# Patient Record
Sex: Male | Born: 1975 | Race: White | Hispanic: No | Marital: Single | Smoking: Current every day smoker
Health system: Southern US, Community
[De-identification: ages and names within clinical notes are randomized; demographics above are authoritative.]

## PROBLEM LIST (undated history)

## (undated) DIAGNOSIS — M25539 Pain in unspecified wrist: Secondary | ICD-10-CM

## (undated) DIAGNOSIS — M549 Dorsalgia, unspecified: Secondary | ICD-10-CM

## (undated) DIAGNOSIS — F32A Depression, unspecified: Secondary | ICD-10-CM

## (undated) DIAGNOSIS — F329 Major depressive disorder, single episode, unspecified: Secondary | ICD-10-CM

## (undated) DIAGNOSIS — F419 Anxiety disorder, unspecified: Secondary | ICD-10-CM

## (undated) HISTORY — PX: CYST REMOVAL NECK: SHX6281

---

## 2007-02-09 ENCOUNTER — Emergency Department: Payer: Self-pay | Admitting: Unknown Physician Specialty

## 2007-09-07 ENCOUNTER — Emergency Department: Payer: Self-pay | Admitting: Emergency Medicine

## 2007-09-21 ENCOUNTER — Emergency Department (HOSPITAL_COMMUNITY): Admission: EM | Admit: 2007-09-21 | Discharge: 2007-09-21 | Payer: Self-pay | Admitting: Emergency Medicine

## 2012-04-26 ENCOUNTER — Emergency Department: Payer: Self-pay | Admitting: Emergency Medicine

## 2012-04-26 LAB — COMPREHENSIVE METABOLIC PANEL
Albumin: 4.1 g/dL (ref 3.4–5.0)
Alkaline Phosphatase: 86 U/L (ref 50–136)
Anion Gap: 2 — ABNORMAL LOW (ref 7–16)
BUN: 16 mg/dL (ref 7–18)
Bilirubin,Total: 0.5 mg/dL (ref 0.2–1.0)
Calcium, Total: 9.8 mg/dL (ref 8.5–10.1)
Chloride: 105 mmol/L (ref 98–107)
Co2: 31 mmol/L (ref 21–32)
Creatinine: 1.12 mg/dL (ref 0.60–1.30)
EGFR (African American): >60
EGFR (Non-African Amer.): >60
Glucose: 107 mg/dL — ABNORMAL HIGH (ref 65–99)
Osmolality: 277 (ref 275–301)
Potassium: 4.7 mmol/L (ref 3.5–5.1)
SGOT(AST): 47 U/L — ABNORMAL HIGH (ref 15–37)
SGPT (ALT): 52 U/L (ref 12–78)
Sodium: 138 mmol/L (ref 136–145)
Total Protein: 7.4 g/dL (ref 6.4–8.2)

## 2012-04-26 LAB — CBC WITH DIFFERENTIAL/PLATELET
Basophil #: 0.1 10*3/uL (ref 0.0–0.1)
Basophil %: 0.4 %
Eosinophil #: 0.4 10*3/uL (ref 0.0–0.7)
Eosinophil %: 3.1 %
HCT: 44.5 % (ref 40.0–52.0)
HGB: 15.3 g/dL (ref 13.0–18.0)
Lymphocyte #: 3.4 10*3/uL (ref 1.0–3.6)
Lymphocyte %: 26.8 %
MCH: 30.6 pg (ref 26.0–34.0)
MCHC: 34.3 g/dL (ref 32.0–36.0)
MCV: 89 fL (ref 80–100)
Monocyte #: 0.7 x10 3/mm (ref 0.2–1.0)
Monocyte %: 5.5 %
Neutrophil #: 8.2 10*3/uL — ABNORMAL HIGH (ref 1.4–6.5)
Neutrophil %: 64.2 %
Platelet: 308 10*3/uL (ref 150–440)
RBC: 5 10*6/uL (ref 4.40–5.90)
RDW: 13.8 % (ref 11.5–14.5)
WBC: 12.8 10*3/uL — ABNORMAL HIGH (ref 3.8–10.6)

## 2012-04-26 LAB — URINALYSIS, COMPLETE
Blood: NEGATIVE
Leukocyte Esterase: NEGATIVE
Protein: NEGATIVE
RBC,UR: <1 /HPF (ref 0–5)
Specific Gravity: 1.03 (ref 1.003–1.030)

## 2013-01-02 ENCOUNTER — Emergency Department: Payer: Self-pay | Admitting: Emergency Medicine

## 2013-05-03 ENCOUNTER — Other Ambulatory Visit: Payer: Self-pay | Admitting: Family Medicine

## 2013-05-03 ENCOUNTER — Ambulatory Visit
Admission: RE | Admit: 2013-05-03 | Discharge: 2013-05-03 | Disposition: A | Discharge status: Home / Self Care | Payer: No Typology Code available for payment source | Source: Ambulatory Visit | Attending: Family Medicine | Admitting: Family Medicine

## 2013-05-03 DIAGNOSIS — M25531 Pain in right wrist: Secondary | ICD-10-CM

## 2013-05-03 DIAGNOSIS — R609 Edema, unspecified: Secondary | ICD-10-CM

## 2014-11-29 ENCOUNTER — Encounter: Payer: Self-pay | Admitting: Emergency Medicine

## 2014-11-29 DIAGNOSIS — Y998 Other external cause status: Secondary | ICD-10-CM | POA: Insufficient documentation

## 2014-11-29 DIAGNOSIS — R0602 Shortness of breath: Secondary | ICD-10-CM | POA: Insufficient documentation

## 2014-11-29 DIAGNOSIS — W19XXXA Unspecified fall, initial encounter: Secondary | ICD-10-CM | POA: Insufficient documentation

## 2014-11-29 DIAGNOSIS — Y9289 Other specified places as the place of occurrence of the external cause: Secondary | ICD-10-CM | POA: Insufficient documentation

## 2014-11-29 DIAGNOSIS — S3992XA Unspecified injury of lower back, initial encounter: Secondary | ICD-10-CM | POA: Insufficient documentation

## 2014-11-29 DIAGNOSIS — Z720 Tobacco use: Secondary | ICD-10-CM | POA: Insufficient documentation

## 2014-11-29 DIAGNOSIS — Y9389 Activity, other specified: Secondary | ICD-10-CM | POA: Insufficient documentation

## 2014-11-29 NOTE — ED Notes (Addendum)
Pt presents to ED with shortness of breath after weed eating earlier today. Unsure of cause. Pt states he just felt "stopped up". Pt states when he tried to blow his nose the right side "doesn't get the right amount of air through it. Pt states "everywhere is cramping" is thinks he may be dehydrated. Pt talkative and alert. Slightly anxious. No audible wheezes present. Pt states he doesn't really have shortness of breath but he just feels like his ears are clogged and his having muscle cramping.

## 2014-11-30 ENCOUNTER — Emergency Department
Admission: EM | Admit: 2014-11-30 | Discharge: 2014-11-30 | Disposition: A | Discharge status: Home / Self Care | Payer: Self-pay | Attending: Emergency Medicine | Admitting: Emergency Medicine

## 2014-11-30 DIAGNOSIS — M545 Low back pain, unspecified: Secondary | ICD-10-CM

## 2014-11-30 LAB — URINALYSIS COMPLETE WITH MICROSCOPIC (ARMC ONLY)
Glucose, UA: NEGATIVE mg/dL
NITRITE: NEGATIVE
PH: 5 (ref 5.0–8.0)
PROTEIN: 100 mg/dL — AB
Specific Gravity, Urine: 1.024 (ref 1.005–1.030)

## 2014-11-30 MED ORDER — KETOROLAC TROMETHAMINE 10 MG PO TABS: 10.0000 mg | ORAL_TABLET | Freq: Three times a day (TID) | ORAL | Status: AC | PRN

## 2014-11-30 MED ORDER — PREDNISONE 20 MG PO TABS: 60.0000 mg | ORAL_TABLET | Freq: Every day | ORAL | Status: AC

## 2014-11-30 MED ORDER — CYCLOBENZAPRINE HCL 10 MG PO TABS
10.0000 mg | ORAL_TABLET | Freq: Once | ORAL | Status: AC
  Administered 2014-11-30: 10 mg via ORAL
  Filled 2014-11-30: qty 1

## 2014-11-30 MED ORDER — KETOROLAC TROMETHAMINE 10 MG PO TABS
10.0000 mg | ORAL_TABLET | Freq: Once | ORAL | Status: AC
  Administered 2014-11-30: 10 mg via ORAL
  Filled 2014-11-30: qty 1

## 2014-11-30 MED ORDER — CYCLOBENZAPRINE HCL 10 MG PO TABS: 10.0000 mg | ORAL_TABLET | Freq: Three times a day (TID) | ORAL | Status: AC | PRN

## 2014-11-30 MED ORDER — PREDNISONE 20 MG PO TABS
60.0000 mg | ORAL_TABLET | Freq: Once | ORAL | Status: AC
  Administered 2014-11-30: 60 mg via ORAL
  Filled 2014-11-30: qty 3

## 2014-11-30 NOTE — ED Provider Notes (Signed)
[facility] Emergency Department Provider Note  ____________________________________________  Time seen: 5:30 AM  I have reviewed the triage vital signs and the nursing notes.   HISTORY  Chief Complaint Shortness of Breath      HPI Casey Jensen is a 39 y.o. male "tense with low back pain status post accidental trip and fall today while"weed eating". Patient states that he's has a history of a accident many years ago with resultant back injury felt as though he is aggravated today. In addition patient states multiple other medical complaints including nasal congestion and dyspnea while weed eating now resolved.       past medical history None There are no active problems to display for this patient.    past surgical history  None  No current outpatient prescriptions on file.  Allergies  no known drug allergies  No family history on file.  Social History Social History  Substance Use Topics  . Smoking status: Current Every Day Smoker -- 0.50 packs/day    Types: Cigarettes  . Smokeless tobacco: None  . Alcohol Use: Yes    Review of Systems  Constitutional: Negative for fever. Eyes: Negative for visual changes. ENT: Negative for sore throat. Cardiovascular: Negative for chest pain. Respiratory: Negative for shortness of breath. Gastrointestinal: Negative for abdominal pain, vomiting and diarrhea. Genitourinary: Negative for dysuria. Musculoskeletal: Positive for back pain. Skin: Negative for rash. Neurological: Negative for headaches, focal weakness or numbness.   10-point ROS otherwise negative.  ____________________________________________   PHYSICAL EXAM:  VITAL SIGNS: ED Triage Vitals  Enc Vitals Group     BP 11/29/14 2318 115/64 mmHg     Pulse Rate 11/29/14 2318 105     Resp 11/29/14 2318 26     Temp 11/29/14 2318 97.7 F (36.5 C)     Temp Source 11/29/14 2318 Oral     SpO2 11/29/14 2318 99 %     Weight 11/29/14  2318 160 lb (72.576 kg)     Height 11/29/14 2318  (1.803 m)     Head Cir --      Peak Flow --      Pain Score 11/29/14 2319 10     Pain Loc --      Pain Edu? --      Excl. in GC? --     Constitutional: Alert and oriented. Well appearing and in no distress. Eyes: Conjunctivae are normal. PERRL. Normal extraocular movements. ENT   Head: Normocephalic and atraumatic.   Nose: No congestion/rhinnorhea.   Mouth/Throat: Mucous membranes are moist.   Neck: No stridor. Hematological/Lymphatic/Immunilogical: No cervical lymphadenopathy. Cardiovascular: Normal rate, regular rhythm. Normal and symmetric distal pulses are present in all extremities. No murmurs, rubs, or gallops. Respiratory: Normal respiratory effort without tachypnea nor retractions. Breath sounds are clear and equal bilaterally. No wheezes/rales/rhonchi. Gastrointestinal: Soft and nontender. No distention. There is no CVA tenderness. Genitourinary: deferred Musculoskeletal: Nontender with normal range of motion in all extremities. No joint effusions.  No lower extremity tenderness nor edema. L1-L2 pain to palpation as well as L4 and 5  Neurologic:  Normal speech and language. No gross focal neurologic deficits are appreciated. Speech is normal.  Skin:  Skin is warm, dry and intact. No rash noted. Psychiatric: Mood and affect are normal. Speech and behavior are normal. Patient exhibits appropriate insight and judgment.  ED ECG REPORT I, Alyas Creary, [place], the attending physician, personally viewed and interpreted this ECG.   Date: 11/30/2014  EKG Time: 11:15  PM  Rate: 95  Rhythm: Normal Sinus rhythm  Axis: None  Intervals: Normal  ST&T Change: None   INITIAL IMPRESSION / ASSESSMENT AND PLAN / ED COURSE  Pertinent labs & imaging results that were available during my care of the patient were reviewed by me and considered in my medical decision making (see chart for details).   patient's history and  physical exam concerning for possible lumbar herniated disc. Patient will receive Flexeril Toradol and prednisone emergency department prescribed same for home. I advised patient to follow-up with Dr. Hyacinth Meeker orthopedic surgeon on call for outpatient MRI._____________________________________   FINAL CLINICAL IMPRESSION(S) / ED DIAGNOSES  Final diagnoses:  Midline low back pain without sciatica      Darci Current, MD 11/30/14 [phone]

## 2014-11-30 NOTE — Discharge Instructions (Signed)
Back Pain, Adult Low back pain is very common. About 1 in 5 people have back pain.The cause of low back pain is rarely dangerous. The pain often gets better over time.About half of people with a sudden onset of back pain feel better in just 2 weeks. About 8 in 10 people feel better by 6 weeks.  CAUSES Some common causes of back pain include:  Strain of the muscles or ligaments supporting the spine.  Wear and tear (degeneration) of the spinal discs.  Arthritis.  Direct injury to the back. DIAGNOSIS Most of the time, the direct cause of low back pain is not known.However, back pain can be treated effectively even when the exact cause of the pain is unknown.Answering your caregiver's questions about your overall health and symptoms is one of the most accurate ways to make sure the cause of your pain is not dangerous. If your caregiver needs more information, he or she may order lab work or imaging tests (X-rays or MRIs).However, even if imaging tests show changes in your back, this usually does not require surgery. HOME CARE INSTRUCTIONS For many people, back pain returns.Since low back pain is rarely dangerous, it is often a condition that people can learn to manageon their own.   Remain active. It is stressful on the back to sit or stand in one place. Do not sit, drive, or stand in one place for more than 30 minutes at a time. Take short walks on level surfaces as soon as pain allows.Try to increase the length of time you walk each day.  Do not stay in bed.Resting more than 1 or 2 days can delay your recovery.  Do not avoid exercise or work.Your body is made to move.It is not dangerous to be active, even though your back may hurt.Your back will likely heal faster if you return to being active before your pain is gone.  Pay attention to your body when you bend and lift. Many people have less discomfortwhen lifting if they bend their knees, keep the load close to their bodies,and  avoid twisting. Often, the most comfortable positions are those that put less stress on your recovering back.  Find a comfortable position to sleep. Use a firm mattress and lie on your side with your knees slightly bent. If you lie on your back, put a pillow under your knees.  Only take over-the-counter or prescription medicines as directed by your caregiver. Over-the-counter medicines to reduce pain and inflammation are often the most helpful.Your caregiver may prescribe muscle relaxant drugs.These medicines help dull your pain so you can more quickly return to your normal activities and healthy exercise.  Put ice on the injured area.  Put ice in a plastic bag.  Place a towel between your skin and the bag.  Leave the ice on for 15-20 minutes, 03-04 times a day for the first 2 to 3 days. After that, ice and heat may be alternated to reduce pain and spasms.  Ask your caregiver about trying back exercises and gentle massage. This may be of some benefit.  Avoid feeling anxious or stressed.Stress increases muscle tension and can worsen back pain.It is important to recognize when you are anxious or stressed and learn ways to manage it.Exercise is a great option. SEEK MEDICAL CARE IF:  You have pain that is not relieved with rest or medicine.  You have pain that does not improve in 1 week.  You have new symptoms.  You are generally not feeling well. SEEK   IMMEDIATE MEDICAL CARE IF:   You have pain that radiates from your back into your legs.  You develop new bowel or bladder control problems.  You have unusual weakness or numbness in your arms or legs.  You develop nausea or vomiting.  You develop abdominal pain.  You feel faint. Document Released: 03/09/2005 Document Revised: 09/08/2011 Document Reviewed: 07/11/2013 Exit[employer] Patient Information 2015 [employer]. This information is not intended to replace advice given to you by your health care provider. Make sure you  discuss any questions you have with your health care provider.  

## 2014-11-30 NOTE — ED Notes (Signed)
Pt was instructed not to drive, he states his brother is driving him home.

## 2015-01-09 ENCOUNTER — Emergency Department (HOSPITAL_COMMUNITY)
Admission: EM | Admit: 2015-01-09 | Discharge: 2015-01-09 | Disposition: A | Discharge status: Home / Self Care | Payer: Self-pay | Attending: Emergency Medicine | Admitting: Emergency Medicine

## 2015-01-09 ENCOUNTER — Emergency Department (HOSPITAL_COMMUNITY): Payer: Self-pay

## 2015-01-09 ENCOUNTER — Encounter (HOSPITAL_COMMUNITY): Payer: Self-pay | Admitting: Emergency Medicine

## 2015-01-09 DIAGNOSIS — Y9289 Other specified places as the place of occurrence of the external cause: Secondary | ICD-10-CM | POA: Insufficient documentation

## 2015-01-09 DIAGNOSIS — W01198A Fall on same level from slipping, tripping and stumbling with subsequent striking against other object, initial encounter: Secondary | ICD-10-CM | POA: Insufficient documentation

## 2015-01-09 DIAGNOSIS — S6991XA Unspecified injury of right wrist, hand and finger(s), initial encounter: Secondary | ICD-10-CM | POA: Insufficient documentation

## 2015-01-09 DIAGNOSIS — Z720 Tobacco use: Secondary | ICD-10-CM | POA: Insufficient documentation

## 2015-01-09 DIAGNOSIS — Y998 Other external cause status: Secondary | ICD-10-CM | POA: Insufficient documentation

## 2015-01-09 DIAGNOSIS — M545 Low back pain, unspecified: Secondary | ICD-10-CM

## 2015-01-09 DIAGNOSIS — S3992XA Unspecified injury of lower back, initial encounter: Secondary | ICD-10-CM | POA: Insufficient documentation

## 2015-01-09 DIAGNOSIS — W19XXXA Unspecified fall, initial encounter: Secondary | ICD-10-CM

## 2015-01-09 DIAGNOSIS — Y9389 Activity, other specified: Secondary | ICD-10-CM | POA: Insufficient documentation

## 2015-01-09 DIAGNOSIS — Z8659 Personal history of other mental and behavioral disorders: Secondary | ICD-10-CM | POA: Insufficient documentation

## 2015-01-09 HISTORY — DX: Dorsalgia, unspecified: M54.9

## 2015-01-09 HISTORY — DX: Depression, unspecified: F32.A

## 2015-01-09 HISTORY — DX: Anxiety disorder, unspecified: F41.9

## 2015-01-09 HISTORY — DX: Major depressive disorder, single episode, unspecified: F32.9

## 2015-01-09 HISTORY — DX: Pain in unspecified wrist: M25.539

## 2015-01-09 MED ORDER — IBUPROFEN 800 MG PO TABS
800.0000 mg | ORAL_TABLET | Freq: Once | ORAL | Status: AC
  Administered 2015-01-09: 800 mg via ORAL

## 2015-01-09 MED ORDER — MELOXICAM 15 MG PO TABS: 15.0000 mg | ORAL_TABLET | Freq: Every day | ORAL | Status: AC

## 2015-01-09 NOTE — ED Provider Notes (Signed)
CSN: [id number]     Arrival date & time 01/09/15  0037 History   First MD Initiated Contact with Patient 01/09/15 0043     Chief Complaint  Patient presents with  . Wrist Pain     (Consider location/radiation/quality/duration/timing/severity/associated sxs/prior Treatment) Patient is a 39 y.o. male presenting with wrist pain and fall. The history is provided by the patient. No language interpreter was used.  Wrist Pain This is a new problem. The current episode started today. The problem occurs constantly. The problem has been unchanged. Associated symptoms include arthralgias. Exacerbated by: movement. He has tried nothing for the symptoms. The treatment provided no relief.  Fall This is a new problem. The current episode started today. The problem occurs constantly. The problem has been unchanged. Associated symptoms include arthralgias. The symptoms are aggravated by bending and walking. He has tried nothing for the symptoms. The treatment provided no relief.    Past Medical History  Diagnosis Date  . Back pain   . Wrist pain   . Anxiety   . Depression    Past Surgical History  Procedure Laterality Date  . Cyst removal neck     No family history on file. Social History  Substance Use Topics  . Smoking status: Current Every Day Smoker -- 0.50 packs/day    Types: Cigarettes  . Smokeless tobacco: None  . Alcohol Use: Yes    Review of Systems  Musculoskeletal: Positive for back pain and arthralgias.  All other systems reviewed and are negative.     Allergies  Review of patient's allergies indicates no known allergies.  Home Medications   Prior to Admission medications   Medication Sig Start Date End Date Taking? Authorizing Provider  cyclobenzaprine (FLEXERIL) 10 MG tablet Take 1 tablet (10 mg total) by mouth 3 (three) times daily as needed for muscle spasms. 11/30/14   Darci Currentandolph N Brown, MD  ketorolac (TORADOL) 10 MG tablet Take 1 tablet (10 mg total) by mouth every  8 (eight) hours as needed. 11/30/14   Darci Currentandolph N Brown, MD   BP 123/85 mmHg  Pulse 85  Temp(Src) 98.4 F (36.9 C) (Oral)  Resp 18  SpO2 100% Physical Exam  Constitutional: He is oriented to person, place, and time. He appears well-developed and well-nourished. No distress.  HENT:  Head: Normocephalic and atraumatic.  Eyes: Conjunctivae and EOM are normal.  Neck: Normal range of motion.  Cardiovascular: Normal rate and regular rhythm.  Exam reveals no gallop and no friction rub.   No murmur heard. Pulmonary/Chest: Effort normal and breath sounds normal. He has no wheezes. He has no rales. He exhibits no tenderness.  Abdominal: Soft. He exhibits no distension. There is no tenderness. There is no rebound.  Musculoskeletal: Normal range of motion.  Dorsal right wrist tenderness to palpation. No obvious deformity. Full ROM. Midline lumbar spine tenderness to palpation.   Neurological: He is alert and oriented to person, place, and time. Coordination normal.  Extremity strength and sensation equal and intact bilaterally. Speech is goal-oriented. Moves limbs without ataxia.   Skin: Skin is warm and dry.  Psychiatric: He has a normal mood and affect. His behavior is normal.  Nursing note and vitals reviewed.   ED Course  Procedures (including critical care time) Labs Review Labs Reviewed - No data to display  Imaging Review Dg Lumbar Spine Complete  01/09/2015  CLINICAL DATA:  Status post fall off ladder 1 month ago, with lower back pain. Initial encounter. EXAM: LUMBAR SPINE - COMPLETE  4+ VIEW COMPARISON:  None. FINDINGS: There is no evidence of fracture or subluxation. A chronic left-sided pars defect is noted at L5. Vertebral bodies demonstrate normal height and alignment. Intervertebral disc spaces are preserved. The visualized neural foramina are grossly unremarkable in appearance. The visualized bowel gas pattern is unremarkable in appearance; air and stool are noted within the colon.  The sacroiliac joints are within normal limits. IMPRESSION: 1. No evidence of fracture or subluxation along the lumbar spine. 2. 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 Electronically Signed   By: Roanna Raider M.D.   On: 01/09/2015 01:33   Dg Wrist Complete Right  01/09/2015  CLINICAL DATA:  Larey Seat off ladder 1 month ago, with medial right wrist pain. Initial encounter. EXAM: RIGHT WRIST - COMPLETE 3+ VIEW COMPARISON:  Right wrist radiographs performed 11/09/2014 FINDINGS: There is no evidence of fracture or dislocation. The carpal rows are intact, and demonstrate normal alignment. The joint spaces are preserved. No significant soft tissue abnormalities are seen. IMPRESSION: No evidence of fracture or dislocation. Electronically Signed   By: Roanna Raider M.D.   On: 01/09/2015 01:32   I have personally reviewed and evaluated these images and lab results as part of my medical decision-making.   EKG Interpretation None      MDM   Final diagnoses:  Fall, initial encounter  Wrist injury, right, initial encounter  Midline low back pain without sciatica    1:04 AM  Right wrist and lumbar spine xray pending. Vitals stable and patient afebrile. No neurovascular compromise.    3:41 AM Xray unremarkable for acute changes. Patient will have mobic for pain.   Emilia Beck, PA-C 01/09/15 1610  Loren Racer, MD 01/09/15 [phone]

## 2015-01-09 NOTE — Discharge Instructions (Signed)
Take mobic as directed for pain. Refer to attached documents for more information.

## 2015-01-09 NOTE — ED Notes (Signed)
Pt arrives via EMS for fall, states he was walking [place] a [place] [address] and fell down a hill. Ambulatory. Chronic back and wrist pain, exacerbated by fall.

## 2015-02-07 ENCOUNTER — Emergency Department
Admission: EM | Admit: 2015-02-07 | Discharge: 2015-02-07 | Disposition: A | Discharge status: Home / Self Care | Payer: No Typology Code available for payment source | Attending: Emergency Medicine | Admitting: Emergency Medicine

## 2015-02-07 ENCOUNTER — Encounter: Payer: Self-pay | Admitting: Emergency Medicine

## 2015-02-07 DIAGNOSIS — G8929 Other chronic pain: Secondary | ICD-10-CM | POA: Insufficient documentation

## 2015-02-07 DIAGNOSIS — F1120 Opioid dependence, uncomplicated: Secondary | ICD-10-CM | POA: Diagnosis not present

## 2015-02-07 DIAGNOSIS — Z791 Long term (current) use of non-steroidal anti-inflammatories (NSAID): Secondary | ICD-10-CM | POA: Insufficient documentation

## 2015-02-07 DIAGNOSIS — F1410 Cocaine abuse, uncomplicated: Secondary | ICD-10-CM | POA: Diagnosis present

## 2015-02-07 DIAGNOSIS — F17210 Nicotine dependence, cigarettes, uncomplicated: Secondary | ICD-10-CM | POA: Diagnosis not present

## 2015-02-07 DIAGNOSIS — F1210 Cannabis abuse, uncomplicated: Secondary | ICD-10-CM | POA: Insufficient documentation

## 2015-02-07 DIAGNOSIS — M549 Dorsalgia, unspecified: Secondary | ICD-10-CM | POA: Diagnosis not present

## 2015-02-07 NOTE — ED Provider Notes (Signed)
[facility] Emergency Department Provider Note  ____________________________________________  Time seen: 3:10 PM  I have reviewed the triage vital signs and the nursing notes.   HISTORY  Chief Complaint Addiction Problem    HPI Casey Jensen is a 39 y.o. male who requests detox for opioid pill addiction. He is released from jail this morning. He was there for 3 weeks. Prior to that, he states that he only used marijuana on occasion. In jail, he did not have access to any drugs and denies any alcohol cocaine or opioid use. He states that he was given Tylenol 3 a few times for a toothache. Since leaving jail this morning he reports that he used a small amount of cocaine about 2 hours ago but no other drugs. Denies any SI or HI or hallucinations. 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  States that he felt his heart racing earlier when he first arrived because he felt nervous, but it feels better now.   Past Medical History  Diagnosis Date  . Back pain   . Wrist pain   . Anxiety   . Depression      There are no active problems to display for this patient.    Past Surgical History  Procedure Laterality Date  . Cyst removal neck       Current Outpatient Rx  Name  Route  Sig  Dispense  Refill  . cyclobenzaprine (FLEXERIL) 10 MG tablet   Oral   Take 1 tablet (10 mg total) by mouth 3 (three) times daily as needed for muscle spasms.   30 tablet   0   . ketorolac (TORADOL) 10 MG tablet   Oral   Take 1 tablet (10 mg total) by mouth every 8 (eight) hours as needed.   20 tablet   0   . meloxicam (MOBIC) 15 MG tablet   Oral   Take 1 tablet (15 mg total) by mouth daily.   20 tablet   0      Allergies Review of patient's allergies indicates no known allergies.   History reviewed. No pertinent family history.  Social History Social History  Substance Use  Topics  . Smoking status: Current Every Day Smoker -- 0.50 packs/day    Types: Cigarettes  . Smokeless tobacco: None  . Alcohol Use: Yes    Review of Systems  Constitutional:   No fever or chills. No weight changes Eyes:   No blurry vision or double vision.  ENT:   No sore throat. Cardiovascular:   No chest pain. Respiratory:   No dyspnea or cough. Gastrointestinal:   Negative for abdominal pain, vomiting and diarrhea.  No BRBPR or melena. Genitourinary:   Negative for dysuria, urinary retention, bloody urine, or difficulty urinating. Musculoskeletal:   Chronic back pain due to bulging disks.. Skin:   Negative for rash. Neurological:   Negative for headaches, focal weakness or numbness. Psychiatric:  No anxiety or depression.   Endocrine:  No hot/cold intolerance, changes in energy, or sleep difficulty.  10-point ROS otherwise negative.  ____________________________________________   PHYSICAL EXAM:  VITAL SIGNS: ED Triage Vitals  Enc Vitals Group     BP 02/07/15 1442 157/75 mmHg     Pulse Rate 02/07/15 1442 125     Resp 02/07/15 1442 20     Temp 02/07/15 1442 98 F (36.7 C)     Temp Source 02/07/15 1442 Oral  SpO2 02/07/15 1442 100 %     Weight 02/07/15 1442 160 lb (72.576 kg)     Height --      Head Cir --      Peak Flow --      Pain Score 02/07/15 1442 0     Pain Loc --      Pain Edu? --      Excl. in GC? --      Constitutional:   Alert and oriented. Well appearing and in no distress. Eyes:   No scleral icterus. No conjunctival pallor. PERRL at 3-4 mm. EOMI, no nystagmus ENT   Head:   Normocephalic and atraumatic.   Nose:   No congestion/rhinnorhea. No septal hematoma   Mouth/Throat:   MMM, no pharyngeal erythema. No peritonsillar mass. No uvula shift.   Neck:   No stridor. No SubQ emphysema. No meningismus. Hematological/Lymphatic/Immunilogical:   No cervical lymphadenopathy. Cardiovascular:   RRR heart rate 90. Normal and symmetric distal  pulses are present in all extremities. No murmurs, rubs, or gallops. Respiratory:   Normal respiratory effort without tachypnea nor retractions. Breath sounds are clear and equal bilaterally. No wheezes/rales/rhonchi. Gastrointestinal:   Soft and nontender. No distention. There is no CVA tenderness.  No rebound, rigidity, or guarding. Genitourinary:   deferred Musculoskeletal:   Nontender with normal range of motion in all extremities. No joint effusions.  No lower extremity tenderness.  No edema. Neurologic:   Normal speech and language.  CN 2-10 normal. Motor grossly intact. No pronator drift.  Normal gait. No gross focal neurologic deficits are appreciated.  Skin:    Skin is warm, dry and intact. No rash noted.  No petechiae, purpura, or bullae. Psychiatric:   Mood and affect are normal. Speech and behavior are normal. Patient exhibits appropriate insight and judgment.  ____________________________________________    LABS (pertinent positives/negatives) (all labs ordered are listed, but only abnormal results are displayed) Labs Reviewed - No data to display ____________________________________________   EKG    ____________________________________________    RADIOLOGY    ____________________________________________   PROCEDURES   ____________________________________________   INITIAL IMPRESSION / ASSESSMENT AND PLAN / ED COURSE  Pertinent labs & imaging results that were available during my care of the patient were reviewed by me and considered in my medical decision making (see chart for details).  Patient well appearing no acute distress. Request inpatient detox for opioid addiction, but by history does not have any significant exposures to opioids or really any other drugs of abuse to develop chemical dependence. There is no evidence that the patient is at risk for benzodiazepine or alcohol withdrawal. He does use gabapentin at home which she says that he has and  has taken some today. His initial tachycardia is likely due to his recent cocaine use just prior to arrival in the emergency department. Already his heart rate has improved to normal and he does not appear to be intoxicated at this time. He is in a normal mental state. He is suitable for outpatient follow-up with community resources. I discussed the case with our behavioral medicine social work liaison who will educate the patient on community resources. She given outpatient follow-up information for [facility].     ____________________________________________   FINAL CLINICAL IMPRESSION(S) / ED DIAGNOSES  Final diagnoses:  Cocaine abuse      Sharman Cheek, MD 02/07/15 1529

## 2015-02-07 NOTE — Discharge Instructions (Signed)
Stimulant Use Disorder-Cocaine  Cocaine is one of a group of powerful drugs called stimulants. Cocaine has medical uses for stopping nosebleeds and for pain control before minor nose or dental surgery. However, cocaine is misused because of the effects that it produces. These effects include:   · A feeling of extreme pleasure.  · Alertness.  · High energy.  Common street names for cocaine include coke, crack, blow, snow, and nose candy. Cocaine is snorted, dissolved in water and injected, or smoked.   Stimulants are addictive because they activate regions of the brain that produce both the pleasurable sensation of "reward" and psychological dependence. Together, these actions account for loss of control and the rapid development of drug dependence. This means you become ill without the drug (withdrawal) and need to keep using it to function.   Stimulant use disorder is use of stimulants that disrupts your daily life. It disrupts relationships with family and friends and how you do your job. Cocaine increases your blood pressure and heart rate. It can cause a heart attack or stroke. Cocaine can also cause death from irregular heart rate or seizures.  SYMPTOMS  Symptoms of stimulant use disorder with cocaine include:  · Use of cocaine in larger amounts or over a longer period of time than intended.  · Unsuccessful attempts to cut down or control cocaine use.  · A lot of time spent obtaining, using, or recovering from the effects of cocaine.  · A strong desire or urge to use cocaine (craving).  · Continued use of cocaine in spite of major problems at work, school, or home because of use.  · Continued use of cocaine in spite of relationship problems because of use.  · Giving up or cutting down on important life activities because of cocaine use.  · Use of cocaine over and over in situations when it is physically hazardous, such as driving a car.  · Continued use of cocaine in spite of a physical problem that is likely  related to use. Physical problems can include:  ¨ Malnutrition.  ¨ Nosebleeds.  ¨ Chest pain.  ¨ High blood pressure.  ¨ A hole that develops between the part of your nose that separates your nostrils (perforated nasal septum).  ¨ Lung and kidney damage.  · Continued use of cocaine in spite of a mental problem that is likely related to use. Mental problems can include:  ¨ Schizophrenia-like symptoms.  ¨ Depression.  ¨ Bipolar mood swings.  ¨ Anxiety.  ¨ Sleep problems.  · Need to use more and more cocaine to get the same effect, or lessened effect over time with use of the same amount of cocaine (tolerance).  · Having withdrawal symptoms when cocaine use is stopped, or using cocaine to reduce or avoid withdrawal symptoms. Withdrawal symptoms include:  ¨ Depressed or irritable mood.  ¨ Low energy or restlessness.  ¨ Bad dreams.  ¨ Poor or excessive sleep.  ¨ Increased appetite.  DIAGNOSIS  Stimulant use disorder is diagnosed by your health care provider. You may be asked questions about your cocaine use and how it affects your life. A physical exam may be done. A drug screen may be ordered. You may be referred to a mental health professional. The diagnosis of stimulant use disorder requires at least two symptoms within 12 months. The type of stimulant use disorder depends on the number of signs and symptoms you have. The type may be:  · Mild. Two or three signs and symptoms.  ·   Moderate. Four or five signs and symptoms.  · Severe. Six or more signs and symptoms.  TREATMENT  Treatment for stimulant use disorder is usually provided by mental health professionals with training in substance use disorders. The following options are available:  · Counseling or talk therapy. Talk therapy addresses the reasons you use cocaine and ways to keep you from using again. Goals of talk therapy include:  ¨ Identifying and avoiding triggers for use.  ¨ Handling cravings.  ¨ Replacing use with healthy activities.  · Support groups.  Support groups provide emotional support, advice, and guidance.  · Medicine. Certain medicines may decrease cocaine cravings or withdrawal symptoms.  HOME CARE INSTRUCTIONS  · Take medicines only as directed by your health care provider.  · Identify the people and activities that trigger your cocaine use and avoid them.  · Keep all follow-up visits as directed by your health care provider.  SEEK MEDICAL CARE IF:  · Your symptoms get worse or you relapse.  · You are not able to take medicines as directed.  SEEK IMMEDIATE MEDICAL CARE IF:  · You have serious thoughts about hurting yourself or others.  · You have a seizure, chest pain, sudden weakness, or loss of speech or vision.  FOR MORE INFORMATION  · National Institute on Drug Abuse: www.drugabuse.gov  · Substance Abuse and Mental Health Services Administration: www.samhsa.gov     This information is not intended to replace advice given to you by your health care provider. Make sure you discuss any questions you have with your health care provider.     Document Released: 03/06/2000 Document Revised: 03/30/2014 Document Reviewed: 03/22/2013  Elsevier Interactive Patient Education ©2016 [employer]

## 2015-02-07 NOTE — ED Notes (Signed)
Pt to ed with c/o "I want o voluntarily commit myself"   Pt reports he was released form jail this am.  Pt reports he is addicted to pain pills and wants detox for same. Pt also reports use of cocaine and ETOH.  States he has used cocaine since release this am from jail.  Denies SI, denies HI, denies hallucinations.

## 2015-02-07 NOTE — ED Notes (Addendum)
CSW Joni Reiningicole at bedside at this time, will d/c pt once CSW assessment is complete.

## 2015-02-07 NOTE — ED Notes (Signed)
MD Stafford at bedside. 

## 2015-02-07 NOTE — ED Notes (Signed)
Pt given belongings back, states everything there that he came in with. Pt d/c at this time with brother.

## 2015-09-25 ENCOUNTER — Emergency Department
Admission: EM | Admit: 2015-09-25 | Discharge: 2015-09-25 | Disposition: A | Discharge status: Home / Self Care | Payer: No Typology Code available for payment source | Attending: Emergency Medicine | Admitting: Emergency Medicine

## 2015-09-25 DIAGNOSIS — F1420 Cocaine dependence, uncomplicated: Secondary | ICD-10-CM

## 2015-09-25 DIAGNOSIS — F1210 Cannabis abuse, uncomplicated: Secondary | ICD-10-CM | POA: Insufficient documentation

## 2015-09-25 DIAGNOSIS — F1910 Other psychoactive substance abuse, uncomplicated: Secondary | ICD-10-CM | POA: Insufficient documentation

## 2015-09-25 DIAGNOSIS — F1024 Alcohol dependence with alcohol-induced mood disorder: Secondary | ICD-10-CM

## 2015-09-25 DIAGNOSIS — M25532 Pain in left wrist: Secondary | ICD-10-CM | POA: Insufficient documentation

## 2015-09-25 DIAGNOSIS — Z791 Long term (current) use of non-steroidal anti-inflammatories (NSAID): Secondary | ICD-10-CM | POA: Insufficient documentation

## 2015-09-25 DIAGNOSIS — F3289 Other specified depressive episodes: Secondary | ICD-10-CM

## 2015-09-25 DIAGNOSIS — Z79899 Other long term (current) drug therapy: Secondary | ICD-10-CM | POA: Insufficient documentation

## 2015-09-25 DIAGNOSIS — G8929 Other chronic pain: Secondary | ICD-10-CM | POA: Insufficient documentation

## 2015-09-25 DIAGNOSIS — F1020 Alcohol dependence, uncomplicated: Secondary | ICD-10-CM

## 2015-09-25 DIAGNOSIS — F17210 Nicotine dependence, cigarettes, uncomplicated: Secondary | ICD-10-CM | POA: Insufficient documentation

## 2015-09-25 DIAGNOSIS — F329 Major depressive disorder, single episode, unspecified: Secondary | ICD-10-CM | POA: Insufficient documentation

## 2015-09-25 DIAGNOSIS — F32A Depression, unspecified: Secondary | ICD-10-CM

## 2015-09-25 LAB — CBC
HCT: 43.6 % (ref 40.0–52.0)
HEMOGLOBIN: 15.2 g/dL (ref 13.0–18.0)
MCH: 30.7 pg (ref 26.0–34.0)
MCHC: 34.8 g/dL (ref 32.0–36.0)
MCV: 88.1 fL (ref 80.0–100.0)
PLATELETS: 303 10*3/uL (ref 150–440)
RBC: 4.95 MIL/uL (ref 4.40–5.90)
RDW: 13.9 % (ref 11.5–14.5)
WBC: 11.8 10*3/uL — ABNORMAL HIGH (ref 3.8–10.6)

## 2015-09-25 LAB — ACETAMINOPHEN LEVEL

## 2015-09-25 LAB — URINE DRUG SCREEN, QUALITATIVE (ARMC ONLY)
AMPHETAMINES, UR SCREEN: NOT DETECTED
Barbiturates, Ur Screen: NOT DETECTED
Benzodiazepine, Ur Scrn: POSITIVE — AB
Cannabinoid 50 Ng, Ur ~~LOC~~: POSITIVE — AB
Cocaine Metabolite,Ur ~~LOC~~: POSITIVE — AB
MDMA (ECSTASY) UR SCREEN: NOT DETECTED
METHADONE SCREEN, URINE: NOT DETECTED
Opiate, Ur Screen: NOT DETECTED
Phencyclidine (PCP) Ur S: NOT DETECTED
TRICYCLIC, UR SCREEN: NOT DETECTED

## 2015-09-25 LAB — COMPREHENSIVE METABOLIC PANEL
ALT: 36 U/L (ref 17–63)
ANION GAP: 10 (ref 5–15)
AST: 41 U/L (ref 15–41)
Albumin: 4.4 g/dL (ref 3.5–5.0)
Alkaline Phosphatase: 60 U/L (ref 38–126)
BUN: 15 mg/dL (ref 6–20)
CALCIUM: 9.5 mg/dL (ref 8.9–10.3)
CHLORIDE: 104 mmol/L (ref 101–111)
CO2: 24 mmol/L (ref 22–32)
Creatinine, Ser: 0.89 mg/dL (ref 0.61–1.24)
Glucose, Bld: 141 mg/dL — ABNORMAL HIGH (ref 65–99)
Potassium: 3.5 mmol/L (ref 3.5–5.1)
SODIUM: 138 mmol/L (ref 135–145)
Total Bilirubin: 1.1 mg/dL (ref 0.3–1.2)
Total Protein: 7.5 g/dL (ref 6.5–8.1)

## 2015-09-25 LAB — SALICYLATE LEVEL

## 2015-09-25 LAB — ETHANOL

## 2015-09-25 NOTE — ED Notes (Signed)
Report to Amy, RN

## 2015-09-25 NOTE — ED Notes (Signed)
Pt given breakfast tray

## 2015-09-25 NOTE — ED Notes (Signed)
Psychiatrist at bedside

## 2015-09-25 NOTE — ED Notes (Signed)
Pt states he wants to commit himself, states he has not slept or eaten very much in the past 4 days and is having thoughts of suicide since, states he was drinking and passed out in the woods about a month ago  And has been having bodyaches, HA and nausea since.

## 2015-09-25 NOTE — ED Notes (Signed)
[facility] psychiatrist coming to see patient and this time. Will move patient after disposition in made per psychiatrist.

## 2015-09-25 NOTE — Discharge Instructions (Signed)
Polysubstance Abuse °When people abuse more than one drug or type of drug it is called polysubstance or polydrug abuse. For example, many smokers also drink alcohol. This is one form of polydrug abuse. Polydrug abuse also refers to the use of a drug to counteract an unpleasant effect produced by another drug. It may also be used to help with withdrawal from another drug. People who take stimulants may become agitated. Sometimes this agitation is countered with a tranquilizer. This helps protect against the unpleasant side effects. Polydrug abuse also refers to the use of different drugs at the same time.  °Anytime drug use is interfering with normal living activities, it has become abuse. This includes problems with family and friends. Psychological dependence has developed when your mind tells you that the drug is needed. This is usually followed by physical dependence which has developed when continuing increases of drug are required to get the same feeling or "high". This is known as addiction or chemical dependency. A person's risk is much higher if there is a history of chemical dependency in the family. °SIGNS OF CHEMICAL DEPENDENCY °· You have been told by friends or family that drugs have become a problem. °· You fight when using drugs. °· You are having blackouts (not remembering what you do while using). °· You feel sick from using drugs but continue using. °· You lie about use or amounts of drugs (chemicals) used. °· You need chemicals to get you going. °· You are suffering in work performance or in school because of drug use. °· You get sick from use of drugs but continue to use anyway. °· You need drugs to relate to people or feel comfortable in social situations. °· You use drugs to forget problems. °"Yes" answered to any of the above signs of chemical dependency indicates there are problems. The longer the use of drugs continues, the greater the problems will become. °If there is a family history of  drug or alcohol use, it is best not to experiment with these drugs. Continual use leads to tolerance. After tolerance develops more of the drug is needed to get the same feeling. This is followed by addiction. With addiction, drugs become the most important part of life. It becomes more important to take drugs than participate in the other usual activities of life. This includes relating to friends and family. Addiction is followed by dependency. Dependency is a condition where drugs are now needed not just to get high, but to feel normal. °Addiction cannot be cured but it can be stopped. This often requires outside help and the care of professionals. Treatment centers are listed in the yellow pages under: Cocaine, Narcotics, and Alcoholics Anonymous. Most hospitals and clinics can refer you to a specialized care center. Talk to your caregiver if you need help. °  °This information is not intended to replace advice given to you by your health care provider. Make sure you discuss any questions you have with your health care provider. °  °Document Released: 10/29/2004 Document Revised: 06/01/2011 Document Reviewed: 03/14/2014 °Elsevier Interactive Patient Education ©2016 [employer] Inc[employer] ° °

## 2015-09-25 NOTE — ED Notes (Addendum)
Pt states that he is here seeking detox and also help for his suicidal thoughts. Pt states that he began drinking on Saturday and has kept drinking for the last 3 days. Pt also reports cocaine, mariajuana and pill use. Pt states that he has stopped drinking before on his own. Pt reports generalized fatigue, concerned about tick bite approx 1 month ago when he "passed out drunk in the woods". Pt states his father committed suicide when he was 8 so he "always thinks about suicide". Pt only 1 attempt X 1 year ago; tried to hang himself but "then I couldn't do it". Denies plan or intent. Only thinks about suicide when he is drinking and using drugs. Pt out of work X 1 year due to wrist injury, belives that he is "bored" with his extra time.   Denies HI.

## 2015-09-25 NOTE — ED Notes (Signed)
Pt alert and oriented X4, active, cooperative, pt in NAD. RR even and unlabored, color WNL.  Pt informed to return if any life threatening symptoms occur.   

## 2015-09-25 NOTE — ED Notes (Signed)
The patient was dressed out into the required purple scrubs. His cloths were placed into a white patient belongings bag and taken to the quad RN. He was walked to room 22 without any issues.  Cloth contents: one black shirt with dog picture on the front of it, one pair grey sweat pants, one pair white socks, one pair black underwear, one pair blue tennis shoes, one pair black sunglasses and one pack of cigarettes with a lighter. All were placed inside his tennis shoes.

## 2015-09-25 NOTE — ED Provider Notes (Signed)
[facility] Emergency Department Provider Note  ____________________________________________    I have reviewed the triage vital signs and the nursing notes.   HISTORY  Chief Complaint Psychiatric Evaluation    HPI Casey Jensen is a 40 y.o. male who presents with complaints of depression, alcohol and drug abuse with thoughts of suicide. Patient reports he drinks alcohol as frequently as he can and often uses cocaine as well. He has been unemployed for the last year he feels depressed and has had thoughts of suicide. He reports he tried to hang himself one year ago but didn't go through with it. He does not have a plan for suicide today but does report significant depression. No alcohol in 48 hours reportedly     Past Medical History  Diagnosis Date  . Back pain   . Wrist pain   . Anxiety   . Depression     There are no active problems to display for this patient.   Past Surgical History  Procedure Laterality Date  . Cyst removal neck      Current Outpatient Rx  Name  Route  Sig  Dispense  Refill  . cyclobenzaprine (FLEXERIL) 10 MG tablet   Oral   Take 1 tablet (10 mg total) by mouth 3 (three) times daily as needed for muscle spasms.   30 tablet   0   . gabapentin (NEURONTIN) 100 MG capsule   Oral   Take 100 mg by mouth 3 (three) times daily.         Marland Kitchen. ketorolac (TORADOL) 10 MG tablet   Oral   Take 1 tablet (10 mg total) by mouth every 8 (eight) hours as needed.   20 tablet   0   . meloxicam (MOBIC) 15 MG tablet   Oral   Take 1 tablet (15 mg total) by mouth daily.   20 tablet   0   . sertraline (ZOLOFT) 25 MG tablet   Oral   Take 25 mg by mouth daily.           Allergies Review of patient's allergies indicates no known allergies.  No family history on file.  Social History Social History  Substance Use Topics  . Smoking status: Current Every Day Smoker -- 0.50 packs/day    Types: Cigarettes  . Smokeless  tobacco: None  . Alcohol Use: Yes    Review of Systems  Constitutional: Negative for fever. Eyes: Negative for redness  Cardiovascular: Negative for chest pain Respiratory: Negative forCough Gastrointestinal: Negative for abdominal pain Genitourinary: Negative for dysuria. Musculoskeletal: Positive for chronic left wrist pain Skin: Negative for rash. Neurological: Negative for headache Psychiatric: Depression as above    ____________________________________________   PHYSICAL EXAM:  VITAL SIGNS: ED Triage Vitals  Enc Vitals Group     BP 09/25/15 0811 135/82 mmHg     Pulse Rate 09/25/15 0811 91     Resp 09/25/15 0811 18     Temp 09/25/15 0811 97.6 F (36.4 C)     Temp Source 09/25/15 0811 Oral     SpO2 09/25/15 0811 98 %     Weight 09/25/15 0811 170 lb (77.111 kg)     Height 09/25/15 0811 5\' 11"  (1.803 m)     Head Cir --      Peak Flow --      Pain Score 09/25/15 0812 8     Pain Loc --      Pain Edu? --      Excl.  in GC? --      Constitutional: Alert and oriented. Well appearing and in no distress. No trembling Eyes: Conjunctivae are normal. No erythema or injection ENT   Head: Normocephalic and atraumatic.   Mouth/Throat: Mucous membranes are moist. Cardiovascular: Normal rate, regular rhythm.  Respiratory: Normal respiratory effort without tachypnea nor retractions. Gastrointestinal: Soft and non-tender in all quadrants. No distention. There is no CVA tenderness. Genitourinary: deferred Musculoskeletal: Nontender with normal range of motion in all extremities. No self injury Neurologic:  Normal speech and language. No gross focal neurologic deficits are appreciated. Skin:  Skin is warm, dry and intact. No rash noted. Psychiatric: Depressed mood. Patient exhibits appropriate insight and judgment.  ____________________________________________    LABS (pertinent positives/negatives)  Labs Reviewed  COMPREHENSIVE METABOLIC PANEL - Abnormal; Notable  for the following:    Glucose, Bld 141 (*)    All other components within normal limits  ACETAMINOPHEN LEVEL - Abnormal; Notable for the following:    Acetaminophen (Tylenol), Serum <10 (*)    All other components within normal limits  CBC - Abnormal; Notable for the following:    WBC 11.8 (*)    All other components within normal limits  URINE DRUG SCREEN, QUALITATIVE ([facility] ONLY) - Abnormal; Notable for the following:    Cocaine Metabolite,Ur [place] POSITIVE (*)    Cannabinoid 50 Ng, Ur [place] POSITIVE (*)    Benzodiazepine, Ur Scrn POSITIVE (*)    All other components within normal limits  ETHANOL  SALICYLATE LEVEL    ____________________________________________   EKG  None  ____________________________________________    RADIOLOGY  None  ____________________________________________   PROCEDURES  Procedure(s) performed: none  Critical Care performed: none  ____________________________________________   INITIAL IMPRESSION / ASSESSMENT AND PLAN / ED COURSE  Pertinent labs & imaging results that were available during my care of the patient were reviewed by me and considered in my medical decision making (see chart for details).  Patient presents with multi-substance abuse, depression with suicidal ideation. He has good insight and I do not feel he requires IVC at this time. I will consult TTS and psychiatry. No evidence of withdrawal at this time, we will continue to monitor  ----------------------------------------- 3:06 PM on 09/25/2015 -----------------------------------------  Patient seen by psychiatry and cleared for discharge  ____________________________________________   FINAL CLINICAL IMPRESSION(S) / ED DIAGNOSES  Final diagnoses:  Polysubstance abuse  Depression          Jene Everyobert Jerald Villalona, MD 09/25/15 1506

## 2015-09-25 NOTE — ED Notes (Signed)
Pt given all of clothing back; pt dressing. Given information for [facility]. Pt states that he has a safe place to go.

## 2015-09-25 NOTE — Consult Note (Signed)
[facility] Face-to-Face Psychiatry Consult   Reason for Consult:  SI Referring Physician:  ER Patient Identification: KYHEEM BATHGATE MRN:  [id number] Principal Diagnosis: Alcohol-induced depressive disorder with moderate or severe use disorder [facility]) Diagnosis:   Patient Active Problem List   Diagnosis Date Noted  . Alcohol use disorder, severe, dependence ([place]) [F10.20] 09/25/2015  . Cocaine use disorder, severe, dependence ([place]) [F14.20] 09/25/2015  . Alcohol-induced depressive disorder with moderate or severe use disorder ([place]) [W26.37, F32.89] 09/25/2015    Total Time spent with patient: 1 hour  Subjective:   Casey Jensen is a 40 y.o. male patient admitted with SI.  HPI:    Casey Jensen is a 40 y.o. male who presents voluntarely with complaints of depression, alcohol and drug abuse with on and off thoughts of suicide. Patient reports he drinks alcohol as frequently as he can and often uses cocaine as well. He has been unemployed for the last year he feels depressed and has had thoughts of suicide, but no plan or intention to harm himself. He reports he tried to hang himself one year ago but didn't go through with it.   Benzo, cocaine and cannabis + on u tox. Alcohol was below detection.  Says he drank for 3 days in a row before coming in.  Pt reports symptoms of depression.  C/o sad mood most of the days, anxiety and has been having suicidal thoughts on and off.  He feels safe going home today but will like to get resources in the community so he can f/u with psychiatry for depression and substance abuse.    Past Psychiatric History: was hospitalized psychiatrically here years ago.  Tried to f/u after that but most places didn't take his insurance at that time so he became frustrated and did not receive any treatment.  No prior substance abuse treatment.  Risk to Self: Suicidal Ideation: Yes-Currently Present Suicidal Intent: No Is patient at risk for suicide?: Yes Suicidal  Plan?: No Access to Means: No What has been your use of drugs/alcohol within the last 12 months?: Alcohol and Cocaine  How many times?: 0 Other Self Harm Risks: None Triggers for Past Attempts: Other (Comment) (N/A) Intentional Self Injurious Behavior: None Risk to Others: Homicidal Ideation: No Thoughts of Harm to Others: No Current Homicidal Intent: No Current Homicidal Plan: No Access to Homicidal Means: No Identified Victim: None  History of harm to others?: No Assessment of Violence: None Noted Violent Behavior Description: None  Does patient have access to weapons?: No Criminal Charges Pending?: Yes Describe Pending Criminal Charges: Larceny  Does patient have a court date: Yes Court Date: 10/21/15 Prior Inpatient Therapy: Prior Inpatient Therapy: No Prior Therapy Dates: N/A Prior Therapy Facilty/Provider(s): N/A Reason for Treatment: N/A Prior Outpatient Therapy: Prior Outpatient Therapy: No Prior Therapy Dates: N/A Prior Therapy Facilty/Provider(s): N/A Reason for Treatment: N/A Does patient have an ACCT team?: No Does patient have Intensive In-House Services?  : No Does patient have [facility] services? : No Does patient have P4CC services?: No  Past Medical History:  Past Medical History  Diagnosis Date  . Back pain   . Wrist pain   . Anxiety   . Depression     Past Surgical History  Procedure Laterality Date  . Cyst removal neck     Family History: No family history on file.  Family Psychiatric  History: father committed suicide when pt was 9 y/o.  There is family history of depression, alcoholism, and suicide.  Social  History: has court coming up on July 31 for larceny. Pt says he has been in prison before.  Thinks he will be send to prison for 10 m. History  Alcohol Use  . Yes     History  Drug Use  . Yes  . Special: Cocaine, Marijuana    Comment: opiates    Social History   Social History  . Marital Status: Single    Spouse Name: N/A  .  Number of Children: N/A  . Years of Education: N/A   Social History Main Topics  . Smoking status: Current Every Day Smoker -- 0.50 packs/day    Types: Cigarettes  . Smokeless tobacco: None  . Alcohol Use: Yes  . Drug Use: Yes    Special: Cocaine, Marijuana     Comment: opiates  . Sexual Activity: Not Asked   Other Topics Concern  . None   Social History Narrative   Additional Social History:    Allergies:  No Known Allergies  Labs:  Results for orders placed or performed during the hospital encounter of 09/25/15 (from the past 48 hour(s))  Comprehensive metabolic panel     Status: Abnormal   Collection Time: 09/25/15  8:15 AM  Result Value Ref Range   Sodium 138 135 - 145 mmol/L   Potassium 3.5 3.5 - 5.1 mmol/L   Chloride 104 101 - 111 mmol/L   CO2 24 22 - 32 mmol/L   Glucose, Bld 141 (H) 65 - 99 mg/dL   BUN 15 6 - 20 mg/dL   Creatinine, Ser 0.89 0.61 - 1.24 mg/dL   Calcium 9.5 8.9 - 10.3 mg/dL   Total Protein 7.5 6.5 - 8.1 g/dL   Albumin 4.4 3.5 - 5.0 g/dL   AST 41 15 - 41 U/L   ALT 36 17 - 63 U/L   Alkaline Phosphatase 60 38 - 126 U/L   Total Bilirubin 1.1 0.3 - 1.2 mg/dL   GFR calc non Af Amer >60 >60 mL/min   GFR calc Af Amer >60 >60 mL/min    Comment: (NOTE) The eGFR has been calculated using the CKD EPI equation. This calculation has not been validated in all clinical situations. eGFR's persistently <60 mL/min signify possible Chronic Kidney Disease.    Anion gap 10 5 - 15  Ethanol     Status: None   Collection Time: 09/25/15  8:15 AM  Result Value Ref Range   Alcohol, Ethyl (B) <5 <5 mg/dL    Comment:        LOWEST DETECTABLE LIMIT FOR SERUM ALCOHOL IS 5 mg/dL FOR MEDICAL PURPOSES ONLY   Salicylate level     Status: None   Collection Time: 09/25/15  8:15 AM  Result Value Ref Range   Salicylate Lvl <9.6 2.8 - 30.0 mg/dL  Acetaminophen level     Status: Abnormal   Collection Time: 09/25/15  8:15 AM  Result Value Ref Range   Acetaminophen  (Tylenol), Serum <10 (L) 10 - 30 ug/mL    Comment:        THERAPEUTIC CONCENTRATIONS VARY SIGNIFICANTLY. A RANGE OF 10-30 ug/mL MAY BE AN EFFECTIVE CONCENTRATION FOR MANY PATIENTS. HOWEVER, SOME ARE BEST TREATED AT CONCENTRATIONS OUTSIDE THIS RANGE. ACETAMINOPHEN CONCENTRATIONS >150 ug/mL AT 4 HOURS AFTER INGESTION AND >50 ug/mL AT 12 HOURS AFTER INGESTION ARE OFTEN ASSOCIATED WITH TOXIC REACTIONS.   cbc     Status: Abnormal   Collection Time: 09/25/15  8:15 AM  Result Value Ref Range   WBC 11.8 (  H) 3.8 - 10.6 K/uL   RBC 4.95 4.40 - 5.90 MIL/uL   Hemoglobin 15.2 13.0 - 18.0 g/dL   HCT 43.6 40.0 - 52.0 %   MCV 88.1 80.0 - 100.0 fL   MCH 30.7 26.0 - 34.0 pg   MCHC 34.8 32.0 - 36.0 g/dL   RDW 13.9 11.5 - 14.5 %   Platelets 303 150 - 440 K/uL  Urine Drug Screen, Qualitative     Status: Abnormal   Collection Time: 09/25/15  8:15 AM  Result Value Ref Range   Tricyclic, Ur Screen NONE DETECTED NONE DETECTED   Amphetamines, Ur Screen NONE DETECTED NONE DETECTED   MDMA (Ecstasy)Ur Screen NONE DETECTED NONE DETECTED   Cocaine Metabolite,Ur [place] POSITIVE (A) NONE DETECTED   Opiate, Ur Screen NONE DETECTED NONE DETECTED   Phencyclidine (PCP) Ur S NONE DETECTED NONE DETECTED   Cannabinoid 50 Ng, Ur  POSITIVE (A) NONE DETECTED   Barbiturates, Ur Screen NONE DETECTED NONE DETECTED   Benzodiazepine, Ur Scrn POSITIVE (A) NONE DETECTED   Methadone Scn, Ur NONE DETECTED NONE DETECTED    Comment: (NOTE) 865  Tricyclics, urine               Cutoff 1000 ng/mL 200  Amphetamines, urine             Cutoff 1000 ng/mL 300  MDMA (Ecstasy), urine           Cutoff 500 ng/mL 400  Cocaine Metabolite, urine       Cutoff 300 ng/mL 500  Opiate, urine                   Cutoff 300 ng/mL 600  Phencyclidine (PCP), urine      Cutoff 25 ng/mL 700  Cannabinoid, urine              Cutoff 50 ng/mL 800  Barbiturates, urine             Cutoff 200 ng/mL 900  Benzodiazepine, urine           Cutoff 200 ng/mL 1000  Methadone, urine                Cutoff 300 ng/mL 1100 1200 The urine drug screen provides only a preliminary, unconfirmed 1300 analytical test result and should not be used for non-medical 1400 purposes. Clinical consideration and professional judgment should 1500 be applied to any positive drug screen result due to possible 1600 interfering substances. A more specific alternate chemical method 1700 must be used in order to obtain a confirmed analytical result.  1800 Gas chromato graphy / mass spectrometry (GC/MS) is the preferred 1900 confirmatory method.     No current facility-administered medications for this encounter.   Current Outpatient Prescriptions  Medication Sig Dispense Refill  . cyclobenzaprine (FLEXERIL) 10 MG tablet Take 1 tablet (10 mg total) by mouth 3 (three) times daily as needed for muscle spasms. 30 tablet 0  . gabapentin (NEURONTIN) 100 MG capsule Take 100 mg by mouth 3 (three) times daily.    Marland Kitchen ketorolac (TORADOL) 10 MG tablet Take 1 tablet (10 mg total) by mouth every 8 (eight) hours as needed. 20 tablet 0  . meloxicam (MOBIC) 15 MG tablet Take 1 tablet (15 mg total) by mouth daily. 20 tablet 0  . sertraline (ZOLOFT) 25 MG tablet Take 25 mg by mouth daily.      Musculoskeletal: Strength & Muscle Tone: within normal limits Gait & Station: normal Patient leans: N/A  Psychiatric Specialty Exam: Physical Exam  Constitutional: He appears well-developed and well-nourished.  HENT:  Head: Normocephalic and atraumatic.  Eyes: EOM are normal.  Neck: Normal range of motion.  Respiratory: Effort normal.  Musculoskeletal: Normal range of motion.  Neurological: He is alert.    Review of Systems  Constitutional: Negative.   HENT: Negative.   Eyes: Negative.   Respiratory: Negative.   Cardiovascular: Negative.   Gastrointestinal: Negative.   Genitourinary: Negative.   Musculoskeletal: Negative.   Skin: Negative.   Neurological: Negative.    Endo/Heme/Allergies: Negative.   Psychiatric/Behavioral: Positive for depression and substance abuse. Negative for suicidal ideas.    Blood pressure 123/94, pulse 83, temperature 98.1 F (36.7 C), temperature source Oral, resp. rate 18, height '5\' 11"'  (1.803 m), weight 77.111 kg (170 lb), SpO2 98 %.Body mass index is 23.72 kg/(m^2).  General Appearance: Fairly Groomed  Eye Contact:  Good  Speech:  Clear and Coherent  Volume:  Normal  Mood:  Dysphoric  Affect:  Appropriate and Congruent  Thought Process:  Linear and Descriptions of Associations: Intact  Orientation:  Full (Time, Place, and Person)  Thought Content:  Hallucinations: None  Suicidal Thoughts:  No  Homicidal Thoughts:  No  Memory:  Immediate;   Good Recent;   Good Remote;   Good  Judgement:  Fair  Insight:  Fair  Psychomotor Activity:  Normal  Concentration:  Concentration: Good and Attention Span: Good  Recall:  Good  Fund of Knowledge:  Good  Language:  Good  Akathisia:  No  Handed:    AIMS (if indicated):     Assets:  Communication Skills Physical Health  ADL's:  Intact  Cognition:  WNL  Sleep:        Treatment Plan Summary:  Pt does not meet criteria for inpatient psychiatric admission. Plan to d/c back home with f/u info for [facility].  Told pt to go to [facility] in am for assessment as they have walk ins.   Case discussed with ER physician.   Disposition: No evidence of imminent risk to self or others at present.   Patient does not meet criteria for psychiatric inpatient admission.  Hildred Priest, MD 09/25/2015 3:06 PM

## 2015-09-25 NOTE — BH Assessment (Signed)
Assessment Note  Casey GaussStephen D Jensen is an 40 y.o. male. Who has voluntarily presented the ED seeking assist with depressive sx and detox from alcohol. Pt Reports that he has not slept or eaten in 3 days. Patient states that this is typical of him when drinking and using drugs. Patient endorses drinking alcohol every two weeks for days at a time. Patient reports that he will go on 3 to 4 day binges. Patient also reports that he uses cocaine, approximately once per week but excessively while intoxicated. Patient states that he walked to the ER today because he has a serious drug and alcohol problem. Patient states "I got a lot on my mind." Patient reports that his father committed suicide when he was 89 years old. Patient also reports that  there is an excessive Mental hx of Health issues throughout the family system. Patient reports that 3 family members have successfully committed suicide. Patient reports that he's constantly had thoughts of suicide, beginning after his father's death. Patient continued on to report that he would not kill himself because he would not want to put his family through the trauma.. Patient tearful and sullen throughout the interview. Patient denies receiving any inpatient or outpatient assistance with his mental health due to lack of insurance. Patient denies any history of neglect or abuse. Pt. denies any suicidal plan or intent. Pt. denies the presence of any auditory or visual hallucinations at this time. Patient was educated about steps to take if suicide or homicide risk level increases between visits. While future psychiatric events cannot be accurately predicted, the patient does not currently require acute inpatient psychiatric care and [facility].  The patient does meet admission criteria at this time. This was explained to the pt, who voiced understanding.   Diagnosis: Major Depressive Disorder, Alcohol Use  Disorder   Past Medical History:  Past Medical History  Diagnosis Date  . Back pain   . Wrist pain   . Anxiety   . Depression     Past Surgical History  Procedure Laterality Date  . Cyst removal neck      Family History: No family history on file.  Social History:  reports that he has been smoking Cigarettes.  He has been smoking about 0.50 packs per day. He does not have any smokeless tobacco history on file. He reports that he drinks alcohol. He reports that he uses illicit drugs (Cocaine and Marijuana).  Additional Social History:  Alcohol / Drug Use Pain Medications: See PTA Prescriptions: See PTA Over the Counter: See PTA History of alcohol / drug use?: Yes Negative Consequences of Use: Financial, Legal Withdrawal Symptoms: Sweats, Irritability Substance #1 Name of Substance 1: Alcohol  1 - Age of First Use: 40 y.o  1 - Amount (size/oz): Varies 1 - Frequency: Every Twos  1 - Duration: Years  1 - Last Use / Amount: 09/24/15 Substance #2 Name of Substance 2: Cocaine  2 - Age of First Use: 40 y.o  2 - Amount (size/oz): $50 2 - Frequency: x1 week, much more when drinking  2 - Duration: years  2 - Last Use / Amount: 09/24/15  CIWA: CIWA-Ar BP: 135/82 mmHg Pulse Rate: 91 Nausea and Vomiting: no nausea and no vomiting Tactile Disturbances: none Tremor: no tremor Auditory Disturbances: not present Paroxysmal Sweats: no sweat visible Visual Disturbances: not present Anxiety: no anxiety, at ease Headache, Fullness in Head: none present Agitation: normal activity Orientation and Clouding of  Sensorium: oriented and can do serial additions CIWA-Ar Total: 0 COWS:    Allergies: No Known Allergies  Home Medications:  (Not in a hospital admission)  OB/GYN Status:  No LMP for male patient.  General Assessment Data Location of Assessment: [facility] ED TTS Assessment: In system Is this a Tele or Face-to-Face Assessment?: Face-to-Face Is this an Initial Assessment or a  Re-assessment for this encounter?: Initial Assessment Marital status: Single Living Arrangements: Other (Comment) (Homeless) Can pt return to current living arrangement?: Yes Admission Status: Voluntary Is patient capable of signing voluntary admission?: Yes Referral Source: Self/Family/Friend Insurance type: None   Medical Screening Exam [facility] Walk-in ONLY) Medical Exam completed: Yes  Crisis Care Plan Living Arrangements: Other (Comment) (Homeless) Legal Guardian: Other: (None ) Name of Psychiatrist: None  Name of Therapist: None  Education Status Is patient currently in school?: No Current Grade: N/A Highest grade of school patient has completed: HS Name of school: N/A Contact person: N/A  Risk to self with the past 6 months Suicidal Ideation: Yes-Currently Present Has patient been a risk to self within the past 6 months prior to admission? : No Suicidal Intent: No Has patient had any suicidal intent within the past 6 months prior to admission? : No Is patient at risk for suicide?: Yes Suicidal Plan?: No Has patient had any suicidal plan within the past 6 months prior to admission? : No Access to Means: No What has been your use of drugs/alcohol within the last 12 months?: Alcohol and Cocaine  Previous Attempts/Gestures: No How many times?: 0 Other Self Harm Risks: None Triggers for Past Attempts: Other (Comment) (N/A) Intentional Self Injurious Behavior: None Family Suicide History: Yes (Father and distant family members ) Recent stressful life event(s): Job Loss, Insurance risk surveyor, Financial Problems Persecutory voices/beliefs?: No Depression: Yes Depression Symptoms: Insomnia, Loss of interest in usual pleasures, Feeling worthless/self pity, Tearfulness Substance abuse history and/or treatment for substance abuse?: Yes Suicide prevention information given to non-admitted patients: Yes  Risk to Others within the past 6 months Homicidal Ideation: No Does patient have  any lifetime risk of violence toward others beyond the six months prior to admission? : No Thoughts of Harm to Others: No Current Homicidal Intent: No Current Homicidal Plan: No Access to Homicidal Means: No Identified Victim: None  History of harm to others?: No Assessment of Violence: None Noted Violent Behavior Description: None  Does patient have access to weapons?: No Criminal Charges Pending?: Yes Describe Pending Criminal Charges: Larceny  Does patient have a court date: Yes Court Date: 10/21/15 Is patient on probation?: No  Psychosis Hallucinations: None noted Delusions: None noted  Mental Status Report Appearance/Hygiene: In scrubs Eye Contact: Fair Motor Activity: Freedom of movement Speech: Slow Level of Consciousness: Alert, Crying Mood: Depressed, Sad Affect: Depressed Anxiety Level: None Thought Processes: Coherent, Relevant Judgement: Partial Orientation: Time, Place, Person, Situation Obsessive Compulsive Thoughts/Behaviors: None  Cognitive Functioning Concentration: Normal Memory: Recent Intact, Remote Intact IQ: Average Insight: Fair Impulse Control: Fair Appetite: Poor Sleep: No Change Total Hours of Sleep: 4 (None when drinking or using ) Vegetative Symptoms: None  ADLScreening [facility] Assessment Services) Patient's cognitive ability adequate to safely complete daily activities?: Yes Patient able to express need for assistance with ADLs?: Yes Independently performs ADLs?: Yes (appropriate for developmental age)  Prior Inpatient Therapy Prior Inpatient Therapy: No Prior Therapy Dates: N/A Prior Therapy Facilty/Provider(s): N/A Reason for Treatment: N/A  Prior Outpatient Therapy Prior Outpatient Therapy: No Prior Therapy Dates: N/A Prior Therapy Facilty/Provider(s):  N/A Reason for Treatment: N/A Does patient have an ACCT team?: No Does patient have Intensive In-House Services?  : No Does patient have [facility] services? : No Does patient  have P4CC services?: No  ADL Screening (condition at time of admission) Patient's cognitive ability adequate to safely complete daily activities?: Yes Patient able to express need for assistance with ADLs?: Yes Independently performs ADLs?: Yes (appropriate for developmental age)       Abuse/Neglect Assessment (Assessment to be complete while patient is alone) Physical Abuse: Denies Verbal Abuse: Denies Sexual Abuse: Denies Exploitation of patient/patient's resources: Denies Self-Neglect: Denies Values / Beliefs Cultural Requests During Hospitalization: None Spiritual Requests During Hospitalization: None Consults Spiritual Care Consult Needed: No Social Work Consult Needed: No Merchant navy officerAdvance Directives (For Healthcare) Does patient have an advance directive?: No Would patient like information on creating an advanced directive?: No - patient declined information    Additional Information 1:1 In Past 12 Months?: No CIRT Risk: No Elopement Risk: No Does patient have medical clearance?: Yes     Disposition:  Disposition Initial Assessment Completed for this Encounter: Yes Disposition of Patient: Outpatient treatment Type of outpatient treatment: Chemical Dependence - Intensive Outpatient, Psych Intensive Outpatient, Adult Patient referred to: Other (Comment)  On Site Evaluation by:   Reviewed with Physician:    Asa SaunasShawanna N Max Nuno 09/25/2015 2:13 PM

## 2017-07-17 IMAGING — CR DG WRIST COMPLETE 3+V*R*
4 series · 4 of 4 positions shown · non-contrast
Comparison: Right wrist radiographs performed 11/09/2014

CLINICAL DATA: Fell off ladder 1 month ago, with medial right wrist
pain. Initial encounter.

EXAM:
RIGHT WRIST - COMPLETE 3+ VIEW

[wrist pa]
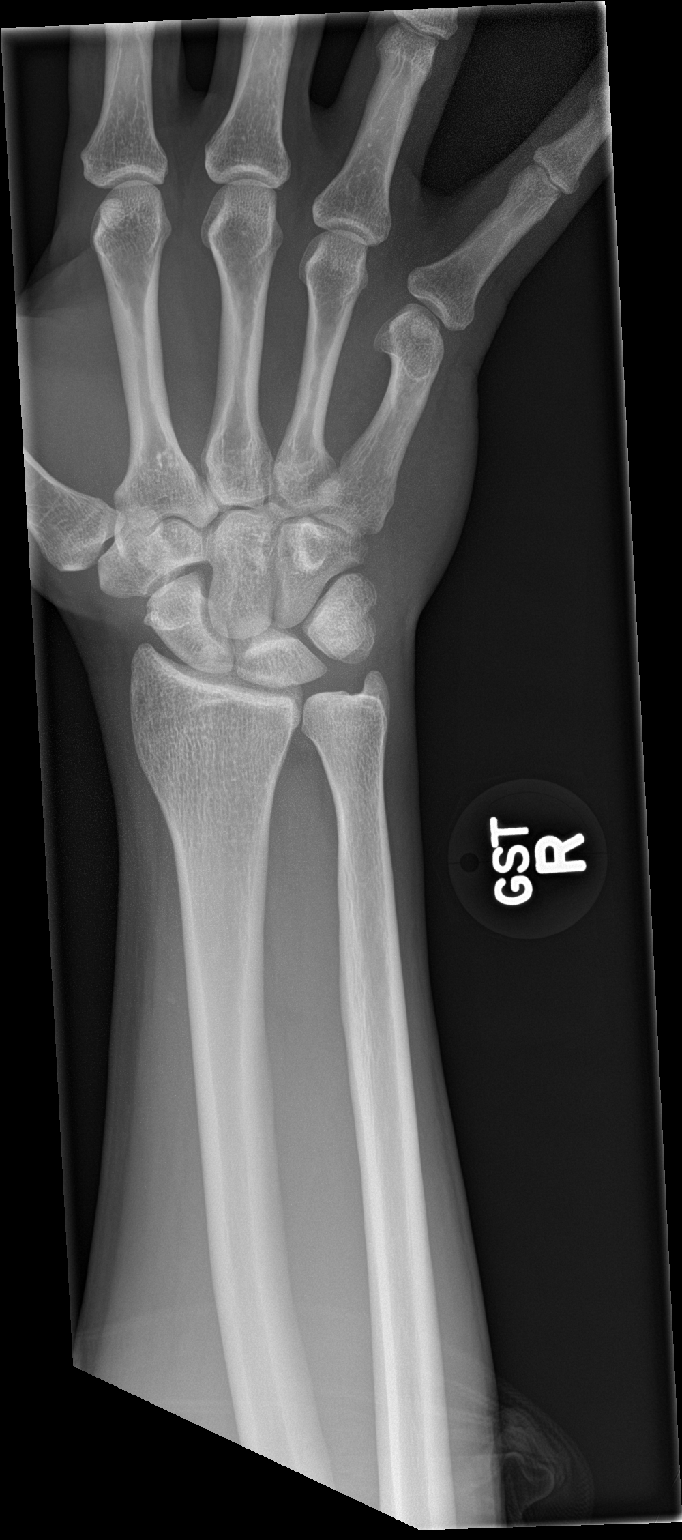

[wrist obl]
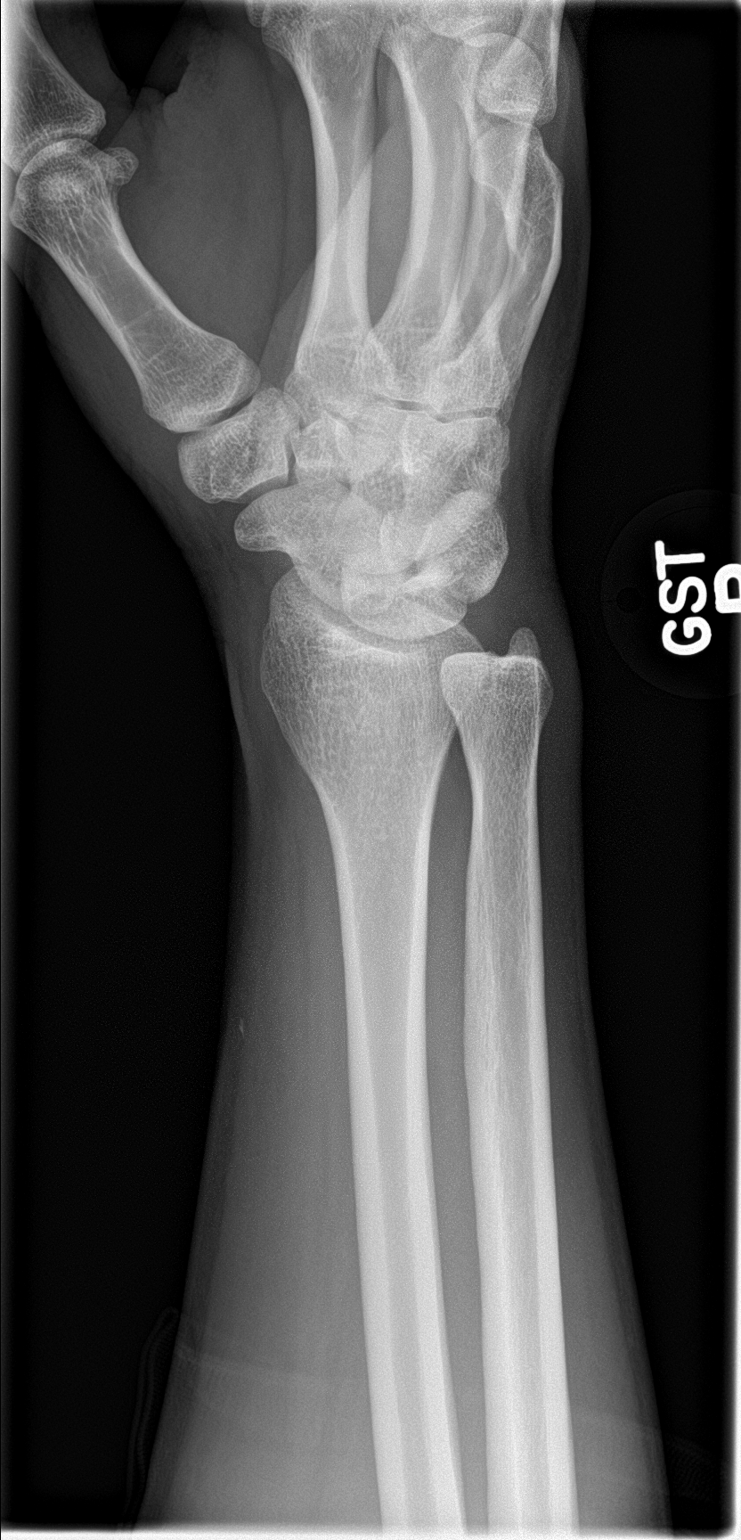

[wrist lat]
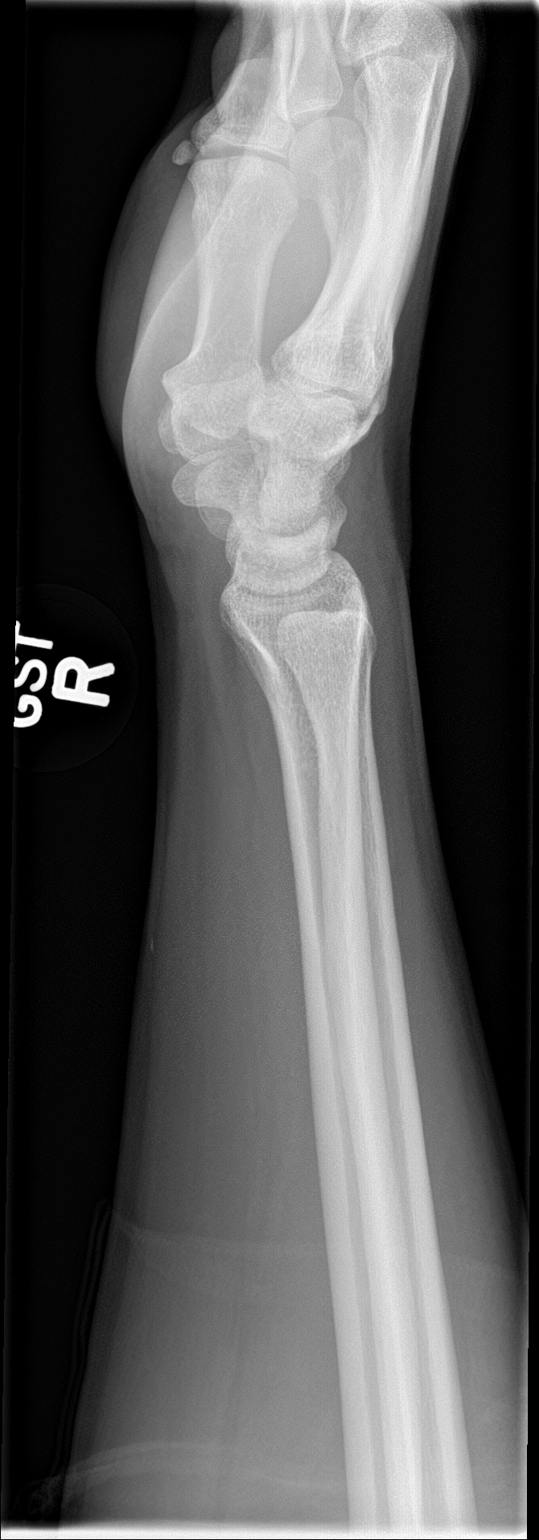

[wrist navicular]
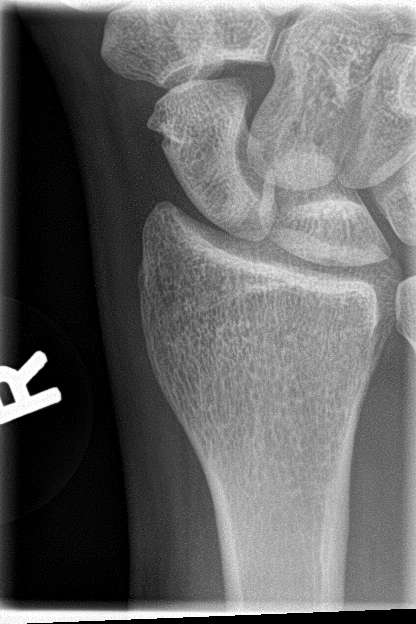

[4 of 4 positions shown; findings below may reference images not displayed]

FINDINGS: There is no evidence of fracture or dislocation. The carpal rows are
intact, and demonstrate normal alignment. The joint spaces are
preserved.

No significant soft tissue abnormalities are seen.
IMPRESSION: No evidence of fracture or dislocation.

## 2017-07-17 IMAGING — CR DG LUMBAR SPINE COMPLETE 4+V
5 series · 5 of 5 positions shown · non-contrast
Comparison: None.

CLINICAL DATA: Status post fall off ladder 1 month ago, with lower
back pain. Initial encounter.

EXAM:
LUMBAR SPINE - COMPLETE 4+ VIEW

[l-spine ap]
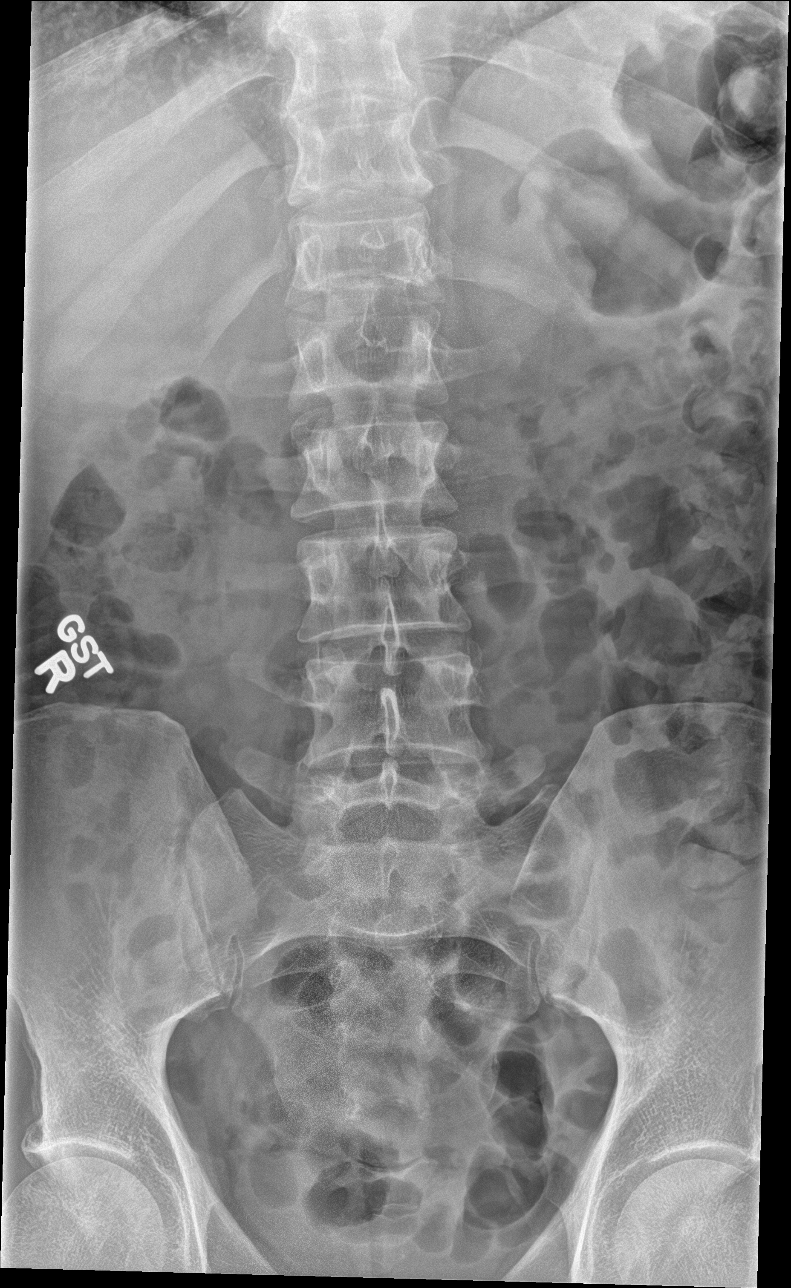

[l-spine obl (1 of 2)]
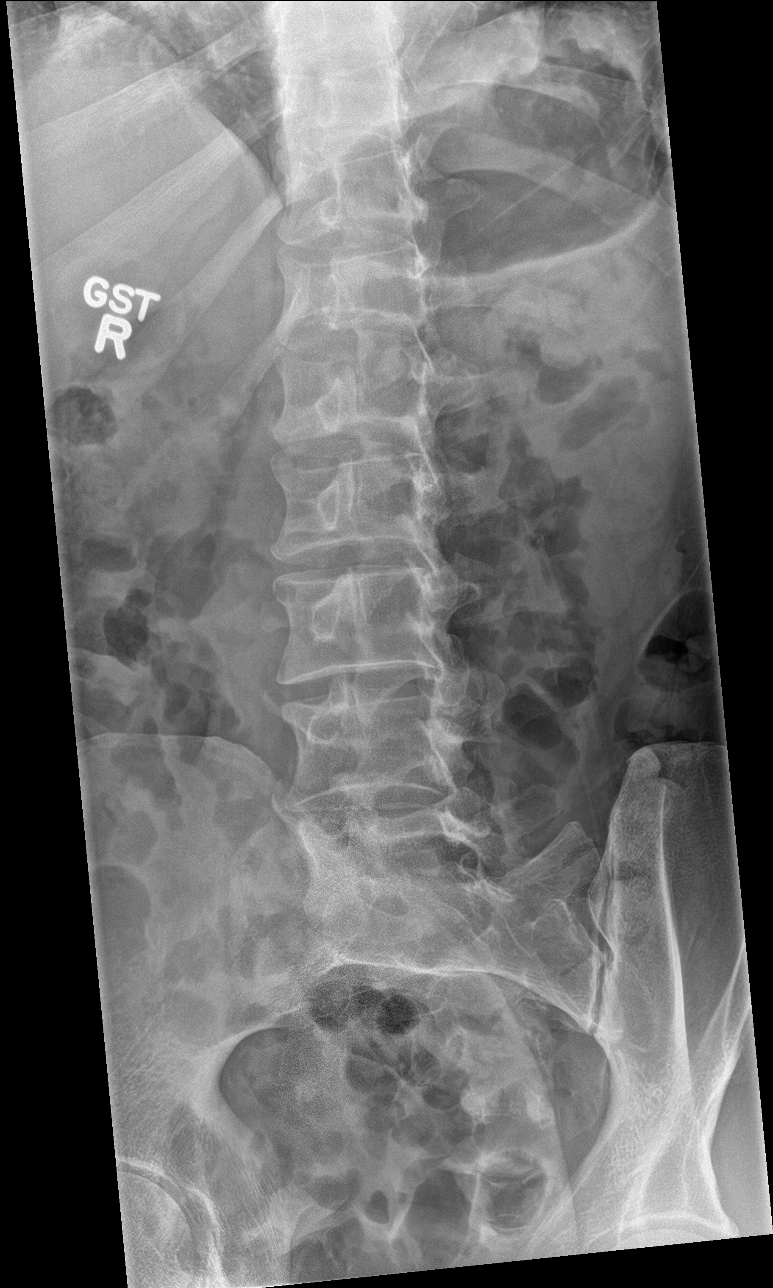

[l-spine obl (2 of 2)]
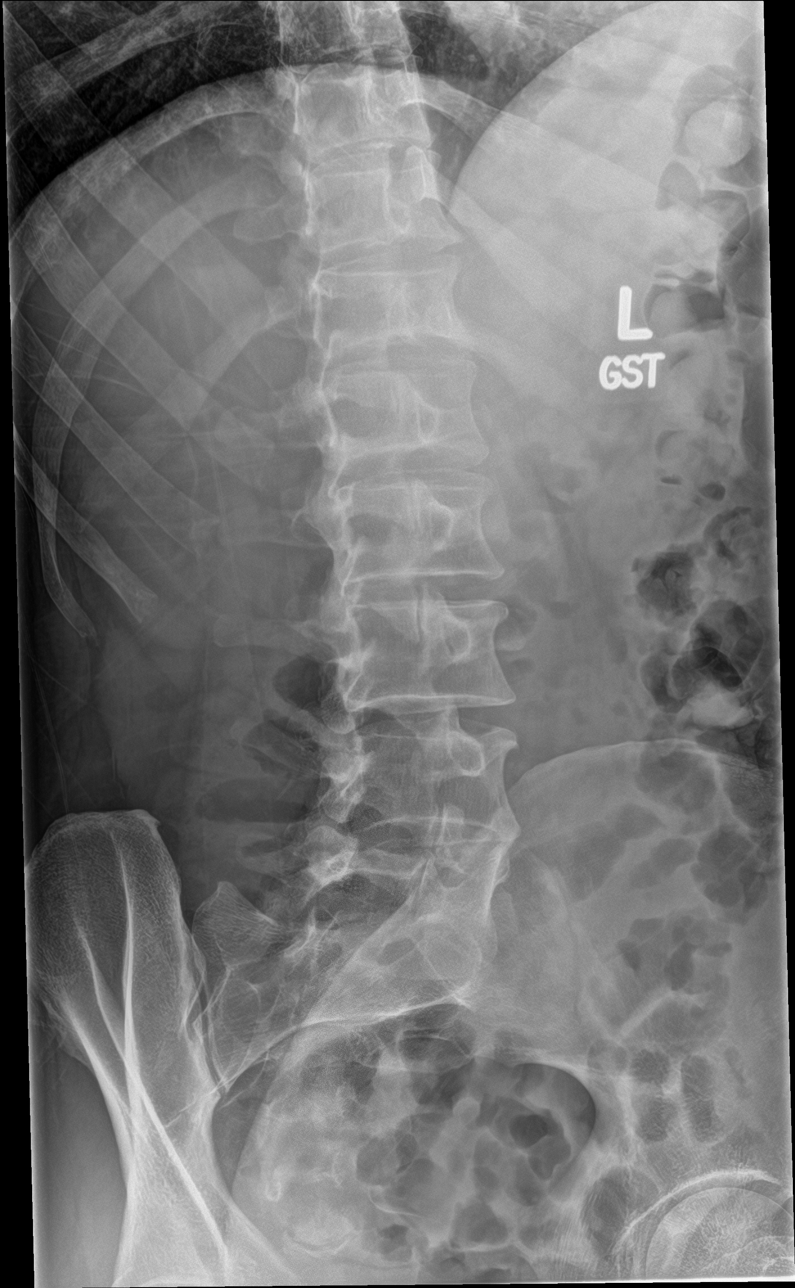

[l-spine lat]
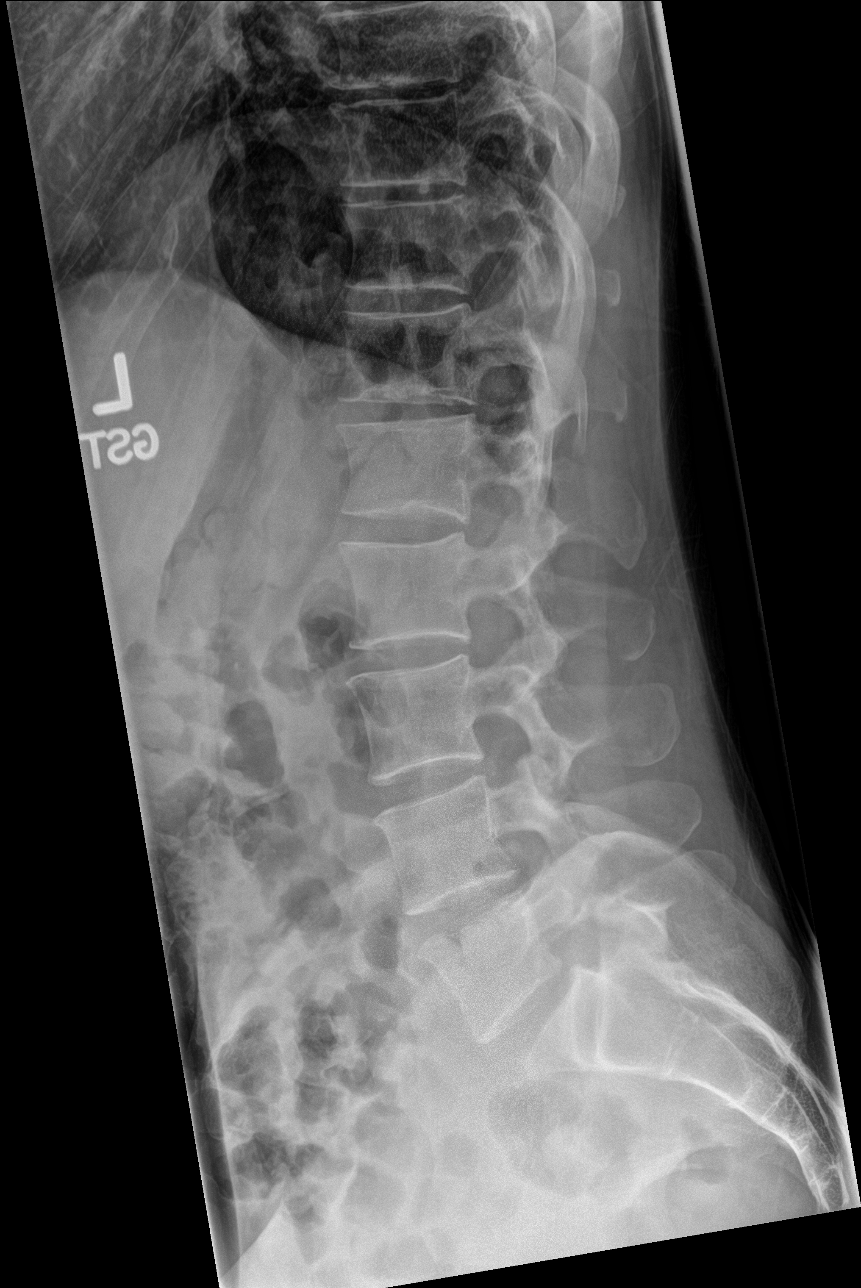

[l-spine spot]
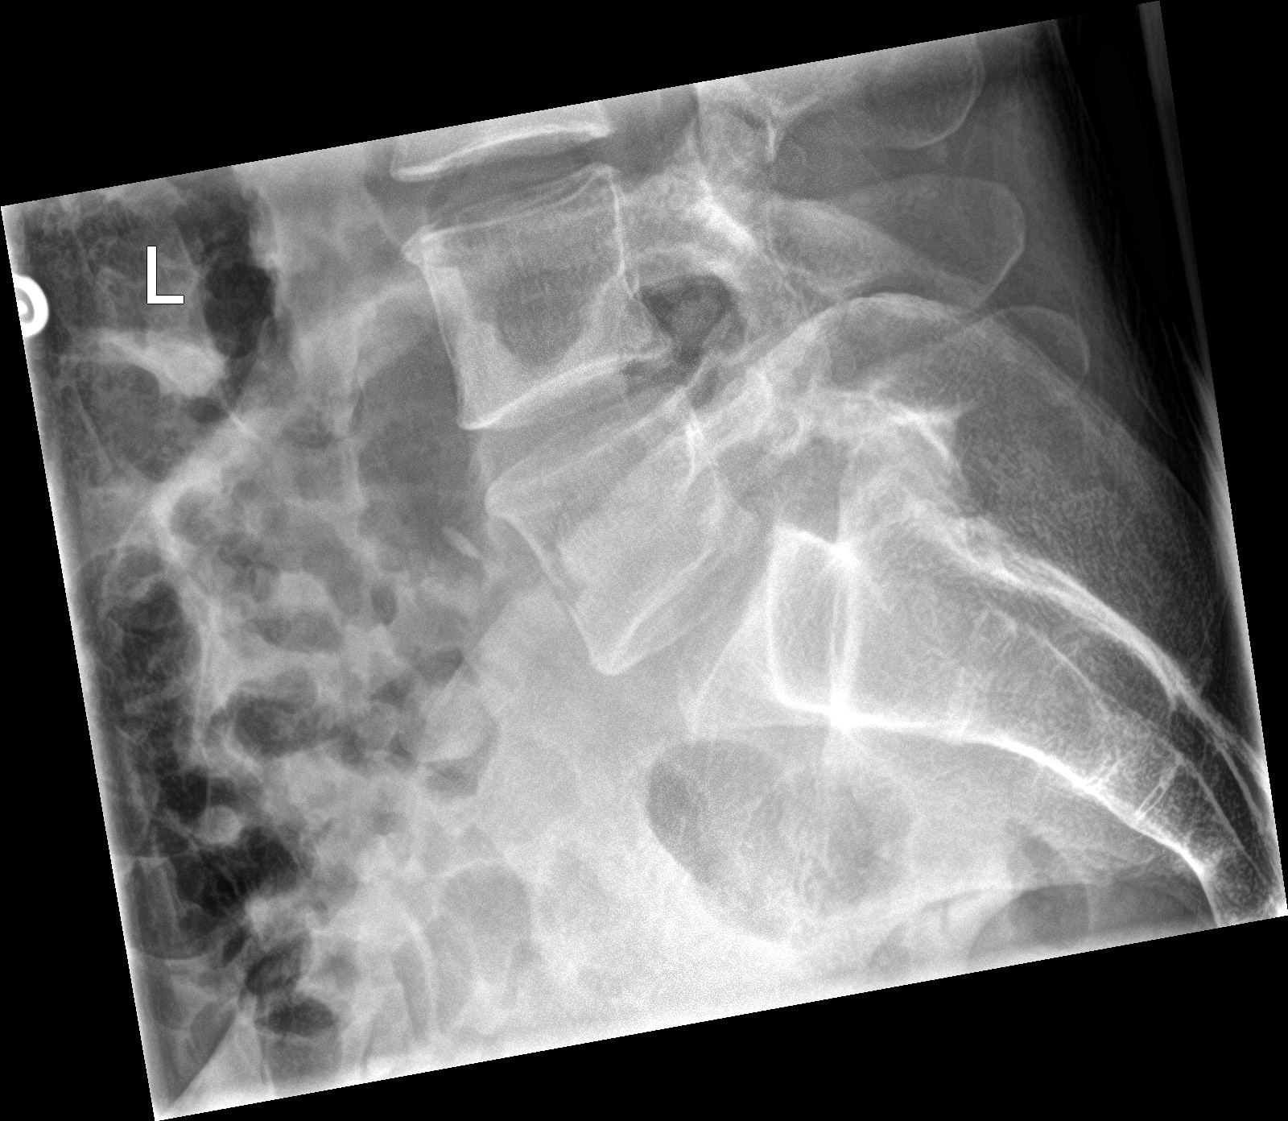

[5 of 5 positions shown; findings below may reference images not displayed]

FINDINGS: There is no evidence of fracture or subluxation. A chronic
left-sided pars defect is noted at L5. Vertebral bodies demonstrate
normal height and alignment. Intervertebral disc spaces are
preserved. The visualized neural foramina are grossly unremarkable
in appearance.

The visualized bowel gas pattern is unremarkable in appearance; air
and stool are noted within the colon. The sacroiliac joints are
within normal limits.
IMPRESSION: 1. No evidence of fracture or subluxation along the lumbar spine.
2. Chronic left-sided pars defect at L5.

## 2019-02-08 ENCOUNTER — Encounter (HOSPITAL_COMMUNITY): Payer: Self-pay

## 2019-02-08 ENCOUNTER — Emergency Department (HOSPITAL_COMMUNITY): Payer: Self-pay

## 2019-02-08 ENCOUNTER — Emergency Department (HOSPITAL_COMMUNITY)
Admission: EM | Admit: 2019-02-08 | Discharge: 2019-02-08 | Disposition: A | Discharge status: Home / Self Care | Payer: Self-pay | Attending: Emergency Medicine | Admitting: Emergency Medicine

## 2019-02-08 ENCOUNTER — Other Ambulatory Visit: Payer: Self-pay

## 2019-02-08 DIAGNOSIS — R109 Unspecified abdominal pain: Secondary | ICD-10-CM | POA: Insufficient documentation

## 2019-02-08 DIAGNOSIS — F1410 Cocaine abuse, uncomplicated: Secondary | ICD-10-CM | POA: Insufficient documentation

## 2019-02-08 DIAGNOSIS — F17210 Nicotine dependence, cigarettes, uncomplicated: Secondary | ICD-10-CM | POA: Insufficient documentation

## 2019-02-08 DIAGNOSIS — N23 Unspecified renal colic: Secondary | ICD-10-CM | POA: Insufficient documentation

## 2019-02-08 DIAGNOSIS — Z79899 Other long term (current) drug therapy: Secondary | ICD-10-CM | POA: Insufficient documentation

## 2019-02-08 DIAGNOSIS — F1210 Cannabis abuse, uncomplicated: Secondary | ICD-10-CM | POA: Insufficient documentation

## 2019-02-08 LAB — URINALYSIS, ROUTINE W REFLEX MICROSCOPIC
Bilirubin Urine: NEGATIVE
Glucose, UA: NEGATIVE mg/dL
Ketones, ur: NEGATIVE mg/dL
Leukocytes,Ua: NEGATIVE
Nitrite: NEGATIVE
Protein, ur: NEGATIVE mg/dL
RBC / HPF: >50 RBC/hpf — ABNORMAL HIGH (ref 0–5)
Specific Gravity, Urine: 1.014 (ref 1.005–1.030)
pH: 7 (ref 5.0–8.0)

## 2019-02-08 LAB — COMPREHENSIVE METABOLIC PANEL
ALT: 25 U/L (ref 0–44)
AST: 31 U/L (ref 15–41)
Albumin: 3.6 g/dL (ref 3.5–5.0)
Alkaline Phosphatase: 69 U/L (ref 38–126)
Anion gap: 7 (ref 5–15)
BUN: 9 mg/dL (ref 6–20)
CO2: 21 mmol/L — ABNORMAL LOW (ref 22–32)
Calcium: 9.2 mg/dL (ref 8.9–10.3)
Chloride: 115 mmol/L — ABNORMAL HIGH (ref 98–111)
Creatinine, Ser: 0.88 mg/dL (ref 0.61–1.24)
GFR calc Af Amer: >60 mL/min (ref >60)
GFR calc non Af Amer: >60 mL/min (ref >60)
Glucose, Bld: 115 mg/dL — ABNORMAL HIGH (ref 70–99)
Potassium: 3.4 mmol/L — ABNORMAL LOW (ref 3.5–5.1)
Sodium: 143 mmol/L (ref 135–145)
Total Bilirubin: 0.9 mg/dL (ref 0.3–1.2)
Total Protein: 6.3 g/dL — ABNORMAL LOW (ref 6.5–8.1)

## 2019-02-08 LAB — CBC WITH DIFFERENTIAL/PLATELET
Abs Immature Granulocytes: 0.03 10*3/uL (ref 0.00–0.07)
Basophils Absolute: 0.1 10*3/uL (ref 0.0–0.1)
Basophils Relative: 0 %
Eosinophils Absolute: 0 10*3/uL (ref 0.0–0.5)
Eosinophils Relative: 0 %
HCT: 33.2 % — ABNORMAL LOW (ref 39.0–52.0)
Hemoglobin: 11.2 g/dL — ABNORMAL LOW (ref 13.0–17.0)
Immature Granulocytes: 0 %
Lymphocytes Relative: 13 %
Lymphs Abs: 1.5 10*3/uL (ref 0.7–4.0)
MCH: 27.8 pg (ref 26.0–34.0)
MCHC: 33.7 g/dL (ref 30.0–36.0)
MCV: 82.4 fL (ref 80.0–100.0)
Monocytes Absolute: 0.3 10*3/uL (ref 0.1–1.0)
Monocytes Relative: 3 %
Neutro Abs: 9.9 10*3/uL — ABNORMAL HIGH (ref 1.7–7.7)
Neutrophils Relative %: 84 %
Platelets: 313 10*3/uL (ref 150–400)
RBC: 4.03 MIL/uL — ABNORMAL LOW (ref 4.22–5.81)
RDW: 15.1 % (ref 11.5–15.5)
WBC: 11.9 10*3/uL — ABNORMAL HIGH (ref 4.0–10.5)
nRBC: 0 % (ref 0.0–0.2)

## 2019-02-08 LAB — RAPID URINE DRUG SCREEN, HOSP PERFORMED
Amphetamines: NOT DETECTED
Barbiturates: NOT DETECTED
Benzodiazepines: NOT DETECTED
Cocaine: POSITIVE — AB
Opiates: NOT DETECTED
Tetrahydrocannabinol: POSITIVE — AB

## 2019-02-08 LAB — LIPASE, BLOOD: Lipase: 32 U/L (ref 11–51)

## 2019-02-08 MED ORDER — IBUPROFEN 800 MG PO TABS
800.0000 mg | ORAL_TABLET | Freq: Once | ORAL | Status: AC
  Administered 2019-02-08: 800 mg via ORAL
  Filled 2019-02-08: qty 1

## 2019-02-08 MED ORDER — SODIUM CHLORIDE 0.9 % IV BOLUS: 1000.0000 mL | Freq: Once | INTRAVENOUS | Status: DC

## 2019-02-08 NOTE — ED Notes (Signed)
Pt off floor in CT 

## 2019-02-08 NOTE — Discharge Instructions (Addendum)
Return for any problem.  Follow-up with your regular care provider as instructed.  Follow-up with urology as instructed.  Drink plenty of fluids.  Use ibuprofen as instructed for pain.

## 2019-02-08 NOTE — ED Provider Notes (Signed)
[facility]-EMERGENCY DEPT Provider Note   CSN: [id number] Arrival date & time: 02/08/19  1842     History   Chief Complaint Chief Complaint  Patient presents with   Dysuria   Abdominal Pain    HPI Casey Jensen is a 43 y.o. male.     43 year old male with prior medical history detailed below presents for evaluation of dysuria.  Patient reports onset of burning and urinary urgency at approximately 5 PM.  He denies fever.  He denies penile discharge.  He denies recent new sexual contact.  He reports that he has not had intercourse in the last 2 years.  He denies prior history of STD.  He does complain of mild suprapubic discomfort as well.  Symptoms and associated with chest pain, shortness of breath, nausea, vomiting, or other acute complaint.  The history is provided by the patient and medical records.  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 Authorizing Provider  Buprenorphine HCl-Naloxone HCl (SUBOXONE) 2-0.5 MG FILM Place 1 Film under the tongue 2 (two) times daily.   Yes [provider]  busPIRone (BUSPAR) 10 MG tablet Take 20 mg by mouth 3 (three) times daily. 12/07/18  Yes [provider]  gabapentin  (NEURONTIN) 100 MG capsule Take 100 mg by mouth 3 (three) times daily.   Yes [provider]  hydrOXYzine (VISTARIL) 50 MG capsule Take 50 mg by mouth 4 (four) times daily. 01/18/19  Yes [provider]  QUEtiapine (SEROQUEL) 100 MG tablet Take 100-300 mg by mouth See admin instructions. Take 100mg  TID and 300mg  qhs 12/12/18  Yes [provider]  sertraline (ZOLOFT) 100 MG tablet Take 150 mg by mouth daily. 01/22/19  Yes [provider]  sildenafil (VIAGRA) 50 MG tablet Take 50 mg by mouth daily as needed. 01/04/19  Yes [provider]  traZODone (DESYREL) 100 MG tablet Take 100 mg by mouth at bedtime. 12/07/18  Yes [provider]  cyclobenzaprine (FLEXERIL) 10 MG tablet Take 1 tablet (10 mg total) by mouth 3 (three) times daily as needed for muscle spasms. Patient not taking: Reported on 02/08/2019 11/30/14   02/10/2019, MD  ketorolac (TORADOL) 10 MG tablet Take 1 tablet (10 mg total) by mouth every 8 (eight) hours as needed. Patient not taking: Reported on 02/08/2019 11/30/14   02/10/2019, MD  meloxicam (MOBIC) 15 MG tablet Take 1 tablet (15 mg total) by mouth daily. Patient not taking: Reported on 02/08/2019 01/09/15   02/10/2019, PA-C    Family History History reviewed. No pertinent family history.  Social History Social History   Tobacco Use   Smoking status: Current Every Day Smoker  Packs/day: 0.50    Types: Cigarettes  Substance Use Topics   Alcohol use: Yes   Drug use: Yes    Types: Cocaine, Marijuana    Comment: opiates     Allergies   Patient has no known allergies.   Review of Systems Review of Systems  Gastrointestinal: Positive for abdominal pain.  Genitourinary: Positive for dysuria.  All other systems reviewed and are negative.    Physical Exam Updated Vital Signs BP 126/74    Pulse (!) 58    Temp 98 F (36.7 C) (Oral)    Resp 18    Ht 6' (1.829 m)    Wt 68 kg    SpO2 100%    BMI  20.34 kg/m   Physical Exam Vitals signs and nursing note reviewed.  Constitutional:      General: He is not in acute distress.    Appearance: He is well-developed.  HENT:     Head: Normocephalic and atraumatic.  Eyes:     Conjunctiva/sclera: Conjunctivae normal.     Pupils: Pupils are equal, round, and reactive to light.  Neck:     Musculoskeletal: Normal range of motion and neck supple.  Cardiovascular:     Rate and Rhythm: Normal rate and regular rhythm.     Heart sounds: Normal heart sounds.  Pulmonary:     Effort: Pulmonary effort is normal. No respiratory distress.     Breath sounds: Normal breath sounds.  Abdominal:     General: Abdomen is flat. Bowel sounds are normal. There is no distension.     Palpations: Abdomen is soft.     Tenderness: There is no abdominal tenderness.  Genitourinary:    Penis: Normal.      Scrotum/Testes: Normal.  Musculoskeletal: Normal range of motion.        General: No deformity.  Skin:    General: Skin is warm and dry.  Neurological:     Mental Status: He is alert and oriented to person, place, and time.      ED Treatments / Results  Labs (all labs ordered are listed, but only abnormal results are displayed) Labs Reviewed  URINALYSIS, ROUTINE W REFLEX MICROSCOPIC - Abnormal; Notable for the following components:      Result Value   Hgb urine dipstick MODERATE (*)    RBC / HPF >50 (*)    Bacteria, UA RARE (*)    All other components within normal limits  RAPID URINE DRUG SCREEN, HOSP PERFORMED - Abnormal; Notable for the following components:   Cocaine POSITIVE (*)    Tetrahydrocannabinol POSITIVE (*)    All other components within normal limits  CBC WITH DIFFERENTIAL/PLATELET - Abnormal; Notable for the following components:   WBC 11.9 (*)    RBC 4.03 (*)    Hemoglobin 11.2 (*)    HCT 33.2 (*)    Neutro Abs 9.9 (*)    All other components within normal limits  COMPREHENSIVE METABOLIC PANEL - Abnormal; Notable for the  following components:   Potassium 3.4 (*)    Chloride 115 (*)    CO2 21 (*)    Glucose, Bld 115 (*)    Total Protein 6.3 (*)    All other components within normal limits  LIPASE, BLOOD  GC/CHLAMYDIA PROBE AMP ([place]) NOT AT [facility]    EKG None  Radiology Ct Renal Stone Study  Result Date: 02/08/2019 CLINICAL DATA:  Flank pain. Lower abdominal pain. Urinary retention. Burning with urination. EXAM: CT ABDOMEN AND PELVIS  WITHOUT CONTRAST TECHNIQUE: Multidetector CT imaging of the abdomen and pelvis was performed following the standard protocol without IV contrast. COMPARISON:  None. FINDINGS: Lower chest: Linear subsegmental atelectasis in both lower lobes. No pleural fluid. Hepatobiliary: No focal liver abnormality is seen. No gallstones, gallbladder wall thickening, or biliary dilatation. Pancreas: No ductal dilatation or inflammation. Spleen: Normal in size without focal abnormality. Adrenals/Urinary Tract: Normal adrenal glands. Mild left hydroureteronephrosis. Left ureter is dilated to the urinary bladder insertion. No ureteral calculi, however there is a punctate stone in the dependent left bladder likely representing a recently passed stone. Mild left perinephric edema. Two punctate nonobstructing stones in the mid lower left kidney. Nonobstructing 5 mm stone in the mid right kidney. No right hydronephrosis. The right ureter is decompressed. No right perinephric edema. Punctate dependent stone in the left urinary bladder. No bladder wall thickening. Stomach/Bowel: Small amount of enteric contrast or high-density ingested material in the distal small bowel and ascending colon. This material opacifies normal appendix, series 2, image 53. No bowel obstruction, wall thickening, or inflammatory change. Small to moderate stool burden in the colon. Vascular/Lymphatic: Minor distal aortic and right iliac atherosclerosis. No aneurysm. No enlarged lymph nodes in the abdomen or pelvis. Reproductive:  Prostate is unremarkable. Other: No free air, free fluid, or intra-abdominal fluid collection. 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 There are no acute or suspicious osseous abnormalities. IMPRESSION: 1. Mild left hydroureteronephrosis to the urinary bladder insertion with a punctate stone in the dependent left bladder, likely representing a recently passed stone. No additional ureteral calculi. 2. Additional nonobstructing stones in both kidneys. 3. 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 Aortic Atherosclerosis (ICD10-I70.0). Electronically Signed   By: Narda RutherfordMelanie  Sanford M.D.   On: 02/08/2019 22:24    Procedures Procedures (including critical care time)  Medications Ordered in ED Medications  sodium chloride 0.9 % bolus 1,000 mL (has no administration in time range)     Initial Impression / Assessment and Plan / ED Course  I have reviewed the triage vital signs and the nursing notes.  Pertinent labs & imaging results that were available during my care of the patient were reviewed by me and considered in my medical decision making (see chart for details).        MDM  Screen complete  Casey Jensen was evaluated in Emergency Department on 02/08/2019 for the symptoms described in the history of present illness. He was evaluated in the context of the global COVID-19 pandemic, which necessitated consideration that the patient might be at risk for infection with the SARS-CoV-2 virus that causes COVID-19. Institutional protocols and algorithms that pertain to the evaluation of patients at risk for COVID-19 are in a state of rapid change based on information released by regulatory bodies including the CDC and federal and state organizations. These policies and algorithms were followed during the patient's care in the ED.  Patient is presenting with complaint of dysuria.  Patient's work-up  reveals presence of recently passed renal stone.  Following his evaluation he feels significantly improved.  He now desires discharge.  Patient does understand the need for close follow-up.  Strict return precautions given and understood.   Final Clinical Impressions(s) / ED Diagnoses   Final diagnoses:  Renal colic    ED Discharge Orders    None       Wynetta FinesMessick, Nadra Hritz C, MD 02/08/19 2308

## 2019-02-08 NOTE — ED Triage Notes (Addendum)
Pt arrived via [facility] from public location CC Abd/urinary retention X 1 hour. Pt reports burning when urinating denies blood in urine  Per EMS A/OX4 VSS.   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 Pt last had suboxone 2 days ago

## 2019-02-08 NOTE — ED Notes (Signed)
Pt made aware need urine sample 

## 2022-02-17 ENCOUNTER — Emergency Department
Admission: EM | Admit: 2022-02-17 | Discharge: 2022-02-17 | Disposition: A | Discharge status: Home / Self Care | Payer: Self-pay | Attending: Emergency Medicine | Admitting: Emergency Medicine

## 2022-02-17 ENCOUNTER — Other Ambulatory Visit: Payer: Self-pay

## 2022-02-17 ENCOUNTER — Encounter: Payer: Self-pay | Admitting: Emergency Medicine

## 2022-02-17 DIAGNOSIS — F1193 Opioid use, unspecified with withdrawal: Secondary | ICD-10-CM

## 2022-02-17 MED ORDER — ONDANSETRON 4 MG PO TBDP
4.0000 mg | ORAL_TABLET | Freq: Once | ORAL | Status: AC
  Administered 2022-02-17: 4 mg via ORAL
  Filled 2022-02-17: qty 1

## 2022-02-17 MED ORDER — CLONIDINE HCL 0.1 MG PO TABS
0.1000 mg | ORAL_TABLET | Freq: Two times a day (BID) | ORAL | 0 refills | Status: AC
  Filled 2022-02-17: qty 4, 2d supply, fill #0

## 2022-02-17 MED ORDER — ONDANSETRON HCL 4 MG PO TABS
4.0000 mg | ORAL_TABLET | Freq: Every day | ORAL | 0 refills | Status: AC | PRN
  Filled 2022-02-17: qty 8, 8d supply, fill #0

## 2022-02-17 MED ORDER — CLONIDINE HCL 0.1 MG PO TABS
0.1000 mg | ORAL_TABLET | Freq: Once | ORAL | Status: AC
  Administered 2022-02-17: 0.1 mg via ORAL
  Filled 2022-02-17: qty 1

## 2022-02-17 MED ORDER — CLONIDINE HCL 0.1 MG PO TABS: 0.1000 mg | ORAL_TABLET | Freq: Two times a day (BID) | ORAL | 0 refills | Status: DC

## 2022-02-17 MED ORDER — ACETAMINOPHEN 500 MG PO TABS
1000.0000 mg | ORAL_TABLET | Freq: Once | ORAL | Status: AC
  Administered 2022-02-17: 1000 mg via ORAL
  Filled 2022-02-17: qty 2

## 2022-02-17 MED ORDER — IBUPROFEN 600 MG PO TABS
600.0000 mg | ORAL_TABLET | Freq: Once | ORAL | Status: AC
  Administered 2022-02-17: 600 mg via ORAL
  Filled 2022-02-17: qty 1

## 2022-02-17 MED ORDER — ONDANSETRON HCL 4 MG PO TABS: 4.0000 mg | ORAL_TABLET | Freq: Every day | ORAL | 0 refills | Status: DC | PRN

## 2022-02-17 NOTE — ED Provider Triage Note (Signed)
Emergency Medicine Provider Triage Evaluation Note  Casey Jensen , a 46 y.o. male  was evaluated in triage.  Pt complains of opiate withdrawal. Went to [facility] for intake, but they only do that on Wednesday and was told to come to the ER. Reports feeling anxious.  Physical Exam  BP (!) 144/91   Pulse 99   Temp 98.1 F (36.7 C) (Oral)   Resp 18   SpO2 99%  Gen:   Awake, no distress   Resp:  Normal effort  MSK:   Moves extremities without difficulty  Other:    Medical Decision Making  Medically screening exam initiated at 3:43 PM.  Appropriate orders placed.  Casey Jensen was informed that the remainder of the evaluation will be completed by another provider, this initial triage assessment does not replace that evaluation, and the importance of remaining in the ED until their evaluation is complete.    Chinita Pester, FNP 02/17/22 1544

## 2022-02-17 NOTE — ED Triage Notes (Signed)
Patient to ED via POV for withdrawals from opiates. Patient states last use was approx 1 week ago. Patient was referred here from [facility] due to not being able to see him till tomorrow.

## 2022-02-17 NOTE — ED Provider Notes (Signed)
[facility] Provider Note    Event Date/Time   First MD Initiated Contact with Patient 02/17/22 1604     (approximate)   History   Withdrawal   HPI  Casey Jensen is a 46 y.o. male   Past medical history of chronic back pain, anxiety and depression who presents to the emergency department with withdrawal from opioids.  He was in jail for the last 20 days and had access to opioid pills which he crushed and snorted.  His last use was several days ago.  He states that he feels like he is withdrawing with some aches, anxiety.  He denies benzodiazepine and alcohol use.  No other medical complaints.  He thought he had a an appointment with substance use treatment today but it turns out it is tomorrow and would like some medications to help with symptoms until then.  History was obtained via patient.      Physical Exam   Triage Vital Signs: ED Triage Vitals  Enc Vitals Group     BP 02/17/22 1538 (!) 144/91     Pulse Rate 02/17/22 1538 99     Resp 02/17/22 1538 18     Temp 02/17/22 1538 98.1 F (36.7 C)     Temp Source 02/17/22 1538 Oral     SpO2 02/17/22 1538 99 %     Weight --      Height --      Head Circumference --      Peak Flow --      Pain Score 02/17/22 1539 0     Pain Loc --      Pain Edu? --      Excl. in GC? --     Most recent vital signs: Vitals:   02/17/22 1538  BP: (!) 144/91  Pulse: 99  Resp: 18  Temp: 98.1 F (36.7 C)  SpO2: 99%    General: Awake, no distress.  CV:  Good peripheral perfusion.  Resp:  Normal effort.  Abd:  No distention.  Other:  No tongue fasciculation or hand tremors to suggest alcohol/benzodiazepine withdrawal.  No piloerection, he appears comfortable.  He has mild hypertension.   ED Results / Procedures / Treatments   Labs (all labs ordered are listed, but only abnormal results are displayed) Labs Reviewed - No data to display    PROCEDURES:  Critical Care performed:  No  Procedures   MEDICATIONS ORDERED IN ED: Medications  cloNIDine (CATAPRES) tablet 0.1 mg (has no administration in time range)  ibuprofen (ADVIL) tablet 600 mg (has no administration in time range)  acetaminophen (TYLENOL) tablet 1,000 mg (has no administration in time range)  ondansetron (ZOFRAN-ODT) disintegrating tablet 4 mg (has no administration in time range)     IMPRESSION / MDM / ASSESSMENT AND PLAN / ED COURSE  I reviewed the triage vital signs and the nursing notes.                              Differential diagnosis includes, but is not limited to, opioid wd, other wd, intoxication, anxiety   The patient is on the cardiac monitor to evaluate for evidence of arrhythmia and/or significant heart rate changes.  MDM: Overall well-appearing patient with no signs of acute emergent withdrawal and no symptoms to suggest any other acute emergent pathologies at this time.  Symptoms most consistent with mild opioid withdrawal.  Hemodynamics appropriate and reassuring.  He  will get symptomatic treatment here with a short prescription to tide him over until his appointment tomorrow.  Patient agreeable to plan.  Patient's presentation is most consistent with acute presentation with potential threat to life or bodily function.       FINAL CLINICAL IMPRESSION(S) / ED DIAGNOSES   Final diagnoses:  Opioid withdrawal ([place])     Rx / DC Orders   ED Discharge Orders          Ordered    cloNIDine (CATAPRES) 0.1 MG tablet  2 times daily        02/17/22 1612    ondansetron (ZOFRAN) 4 MG tablet  Daily PRN        02/17/22 1612             Note:  This document was prepared using Dragon voice recognition software and may include unintentional dictation errors.    Lucillie Garfinkel, MD 02/17/22 [phone]

## 2022-02-17 NOTE — Discharge Instructions (Addendum)
Use medications as prescribed. See attached list of resources to call for substance use treatment.  Take acetaminophen 650 mg and ibuprofen 400 mg every 6 hours for pain.  Take with food. Go to your substance use treatment as planned tomorrow.  Thank you for choosing [place] for your health care today!  Please see your primary doctor this week for a follow up appointment.   If you do not have a primary doctor call the following clinics to establish care:  If you have insurance:  [facility] [phone] [address] [place] [place] [address]   [facility]  [phone] [address] [place] [place] [address]   If you do not have insurance:  [facility]  [phone] [address] [place] [address]  Sometimes, in the early stages of certain disease courses it is difficult to detect in the emergency department evaluation -- so, it is important that you continue to monitor your symptoms and call your doctor right away or return to the emergency department if you develop any new or worsening symptoms.  It was my pleasure to care for you today.   Daneil Dan Modesto Charon, MD

## 2022-04-09 ENCOUNTER — Other Ambulatory Visit: Payer: Self-pay
